# Patient Record
Sex: Male | Born: 1995 | Race: White | Hispanic: No | Marital: Single | Smoking: Current every day smoker
Health system: Southern US, Community
[De-identification: ages and names within clinical notes are randomized; demographics above are authoritative.]

## PROBLEM LIST (undated history)

## (undated) DIAGNOSIS — F32A Depression, unspecified: Secondary | ICD-10-CM

## (undated) DIAGNOSIS — F329 Major depressive disorder, single episode, unspecified: Secondary | ICD-10-CM

## (undated) HISTORY — PX: TONSILLECTOMY: SUR1361

---
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
## 2013-01-17 DIAGNOSIS — N4889 Other specified disorders of penis: Secondary | ICD-10-CM | POA: Insufficient documentation

## 2013-01-17 DIAGNOSIS — F17200 Nicotine dependence, unspecified, uncomplicated: Secondary | ICD-10-CM | POA: Insufficient documentation

## 2013-01-17 DIAGNOSIS — R110 Nausea: Secondary | ICD-10-CM | POA: Insufficient documentation

## 2013-01-17 DIAGNOSIS — Z880 Allergy status to penicillin: Secondary | ICD-10-CM | POA: Insufficient documentation

## 2013-01-17 DIAGNOSIS — R109 Unspecified abdominal pain: Secondary | ICD-10-CM | POA: Insufficient documentation

## 2013-01-17 DIAGNOSIS — Z8659 Personal history of other mental and behavioral disorders: Secondary | ICD-10-CM | POA: Insufficient documentation

## 2013-01-17 DIAGNOSIS — N509 Disorder of male genital organs, unspecified: Secondary | ICD-10-CM | POA: Insufficient documentation

## 2013-01-18 ENCOUNTER — Emergency Department (HOSPITAL_COMMUNITY)
Admission: EM | Admit: 2013-01-18 | Discharge: 2013-01-18 | Disposition: A | Discharge status: Home / Self Care | Payer: Medicaid Other | Attending: Emergency Medicine | Admitting: Emergency Medicine

## 2013-01-18 ENCOUNTER — Encounter (HOSPITAL_COMMUNITY): Payer: Self-pay | Admitting: Emergency Medicine

## 2013-01-18 ENCOUNTER — Emergency Department (HOSPITAL_COMMUNITY): Payer: Medicaid Other

## 2013-01-18 DIAGNOSIS — N50811 Right testicular pain: Secondary | ICD-10-CM

## 2013-01-18 HISTORY — DX: Major depressive disorder, single episode, unspecified: F32.9

## 2013-01-18 HISTORY — DX: Depression, unspecified: F32.A

## 2013-01-18 LAB — URINALYSIS, ROUTINE W REFLEX MICROSCOPIC
Glucose, UA: NEGATIVE mg/dL
Hgb urine dipstick: NEGATIVE
Ketones, ur: NEGATIVE mg/dL
Leukocytes, UA: NEGATIVE
Protein, ur: 30 mg/dL — AB
pH: 6 (ref 5.0–8.0)

## 2013-01-18 LAB — URINE MICROSCOPIC-ADD ON

## 2013-01-18 MED ORDER — IBUPROFEN 600 MG PO TABS
600.0000 mg | ORAL_TABLET | Freq: Four times a day (QID) | ORAL | Status: DC | PRN
Start: 2013-01-18 — End: 2013-01-18

## 2013-01-18 MED ORDER — ONDANSETRON HCL 4 MG/2ML IJ SOLN
4.0000 mg | Freq: Once | INTRAMUSCULAR | Status: AC
  Administered 2013-01-18: 4 mg via INTRAVENOUS
  Filled 2013-01-18: qty 2

## 2013-01-18 MED ORDER — SODIUM CHLORIDE 0.9 % IV SOLN
Freq: Once | INTRAVENOUS | Status: AC
  Administered 2013-01-18: 01:00:00 via INTRAVENOUS

## 2013-01-18 MED ORDER — IBUPROFEN 600 MG PO TABS: 600.0000 mg | ORAL_TABLET | Freq: Four times a day (QID) | ORAL | Status: DC | PRN

## 2013-01-18 MED ORDER — HYDROMORPHONE HCL PF 1 MG/ML IJ SOLN
1.0000 mg | Freq: Once | INTRAMUSCULAR | Status: AC
  Administered 2013-01-18: 1 mg via INTRAVENOUS
  Filled 2013-01-18: qty 1

## 2013-01-18 MED ORDER — HYDROMORPHONE HCL PF 1 MG/ML IJ SOLN
1.0000 mg | Freq: Once | INTRAMUSCULAR | Status: AC
  Administered 2013-01-18: 1 mg via INTRAMUSCULAR
  Filled 2013-01-18: qty 1

## 2013-01-18 MED ORDER — KETOROLAC TROMETHAMINE 30 MG/ML IJ SOLN
30.0000 mg | Freq: Once | INTRAMUSCULAR | Status: AC
  Administered 2013-01-18: 30 mg via INTRAVENOUS
  Filled 2013-01-18: qty 1

## 2013-01-18 NOTE — ED Notes (Signed)
Pt comfortable with d/c and f/u instructions. Prescriptions x1 

## 2013-01-18 NOTE — ED Provider Notes (Signed)
History    CSN: [id number] Arrival date & time 01/17/13  2358  First MD Initiated Contact with Patient 01/18/13 0026     Chief Complaint  Patient presents with  . Testicle Pain   (Consider location/radiation/quality/duration/timing/severity/associated sxs/prior Treatment) HPI Comments: Patient states, that immediately after having intercourse.  He developed right testicular pain.  That is excruciating and is now radiating into his abdomen.  Denies any injury, or trauma.  Patient is a 17 y.o. male presenting with testicular pain.  Testicle Pain This is a new problem. The current episode started today. The problem occurs constantly. The problem has been gradually worsening. Associated symptoms include abdominal pain and nausea. Pertinent negatives include no chills, fever or rash. The symptoms are aggravated by exertion and walking. He has tried nothing for the symptoms. The treatment provided no relief.   Past Medical History  Diagnosis Date  . Depression    Past Surgical History  Procedure Laterality Date  . Tonsillectomy     Family History  Problem Relation Age of Onset  . Diabetes     History  Substance Use Topics  . Smoking status: Current Every Day Smoker -- 1.00 packs/day    Types: Cigarettes  . Smokeless tobacco: Not on file  . Alcohol Use: Yes     Comment: social    Review of Systems  Constitutional: Negative for fever and chills.  Gastrointestinal: Positive for nausea and abdominal pain.  Genitourinary: Positive for penile swelling and testicular pain. Negative for discharge, scrotal swelling, genital sores and penile pain.  Skin: Negative for rash and wound.  All other systems reviewed and are negative.    Allergies  Penicillins  Home Medications   Current Outpatient Rx  Name  Route  Sig  Dispense  Refill  . acetaminophen (TYLENOL) 500 MG tablet   Oral   Take 250 mg by mouth once.         Marland Kitchen ibuprofen (ADVIL,MOTRIN) 600 MG tablet   Oral   Take  1 tablet (600 mg total) by mouth every 6 (six) hours as needed for pain.   30 tablet   0    BP 154/89  Pulse 97  Temp(Src) 98.1 F (36.7 C) (Oral)  Resp 18  SpO2 99% Physical Exam  Nursing note and vitals reviewed. Constitutional: He appears well-developed and well-nourished. He appears distressed.  Eyes: Pupils are equal, round, and reactive to light.  Neck: Normal range of motion.  Cardiovascular: Normal rate.   Pulmonary/Chest: Effort normal and breath sounds normal.  Abdominal: Soft. Bowel sounds are normal. He exhibits no distension. There is no tenderness.  Genitourinary: Right testis shows tenderness. Right testis shows no swelling. Cremasteric reflex is absent on the right side. Left testis shows no swelling and no tenderness. Cremasteric reflex is not absent on the left side. No penile tenderness.  Right testis is high riding the cremasteric reflexes.  Absent on the right, present on the left    ED Course  Procedures (including critical care time) Labs Reviewed  URINALYSIS, ROUTINE W REFLEX MICROSCOPIC - Abnormal; Notable for the following:    Color, Urine AMBER (*)    Protein, ur 30 (*)    All other components within normal limits  URINE MICROSCOPIC-ADD ON   No results found. 1. Testicular pain, right     MDM   Will get urgent ultrasound to rule out testicular torsion.  Patient has been given IV, dilated.  For pain control, as well as Zofran 4, nausea Dr.  Wrenn to the bedside and discharge the patient with negative findings for torsion or epididymitis  Patient instructed to FU in office in 203 weeks   Arman Filter, NP 01/18/13 [phone]

## 2013-01-18 NOTE — Consult Note (Signed)
Subjective: Eugene Cohen is an 17 yo WM who I was asked to see in consultation by Dr. Norlene Campbell.   He had the onset about 5 minutes after sexual activity of right testicular pain.  This occurred about 5 hours ago.   The pain began in the inferior testicle and then spread to the anterior testicle with some radiation to the left.   The pain was severe with associated nausea but no voiding complaints.   When he was initially seen, it was reported that the testicle was high riding and the cremasteric reflex was lost.   He was given 2 mg of dilaudid and sent for scrotal US.   The US shows no abnormalities and good blood flow ruling out current torsion.  His UA has 3-6 RBC's, 0-2 WBC and few bacteria.    ROS: Negative except as above.   He denies fever and a 12 point review is negative.   Past Medical History  Diagnosis Date  . Depression    Past Surgical History  Procedure Laterality Date  . Tonsillectomy     History   Social History  . Marital Status: Single    Spouse Name: N/A    Number of Children: N/A  . Years of Education: N/A   Occupational History  . Not on file.   Social History Main Topics  . Smoking status: Current Every Day Smoker -- 1.00 packs/day    Types: Cigarettes  . Smokeless tobacco: Not on file  . Alcohol Use: Yes     Comment: social  . Drug Use: Yes    Special: Marijuana  . Sexually Active: Not on file   Other Topics Concern  . Not on file   Social History Narrative  . No narrative on file   Family History  Problem Relation Age of Onset  . Diabetes     Allergies  Allergen Reactions  . Penicillins Swelling       Objective: Vital signs in last 24 hours: Temp:  [98.1 F (36.7 C)] 98.1 F (36.7 C) (07/21 0006) Pulse Rate:  [97] 97 (07/21 0006) Resp:  [18] 18 (07/21 0006) BP: (154)/(89) 154/89 mmHg (07/21 0006) SpO2:  [99 %] 99 % (07/21 0006)  Intake/Output from previous day:   Intake/Output this shift:    General appearance: alert and no  distress Resp: clear to auscultation bilaterally Cardio: regular rate and rhythm GI: soft, non-tender; bowel sounds normal; no masses,  no organomegaly Male genitalia: normal, with a circumcised phallus.  Scrotum is not erythematous.  The testes are normal without significant tenderness or mass,  Epididymes are normal without mass or tenderness..  No hernias are noted.  Extremities: extremities normal, atraumatic, no cyanosis or edema Skin: Skin color, texture, turgor normal. No rashes or lesions Lymph nodes: Inguinal adenopathy: He has some shotty inguinal nodes.  Neurologic: Grossly normal  Lab Results:  No results found for this basename: WBC, HGB, HCT, PLT,  in the last 72 hours BMET No results found for this basename: NA, K, CL, CO2, GLUCOSE, BUN, CREATININE, CALCIUM,  in the last 72 hours PT/INR No results found for this basename: LABPROT, INR,  in the last 72 hours ABG No results found for this basename: PHART, PCO2, PO2, HCO3,  in the last 72 hours  Studies/Results: No results found. UA noted above.  Scrotal US reviewed and is normal.  I discussed the case with Earley Favor.  Anti-infectives: Anti-infectives   None      No current  facility-administered medications for this encounter.   Current Outpatient Prescriptions  Medication Sig Dispense Refill  . acetaminophen (TYLENOL) 500 MG tablet Take 250 mg by mouth once.        Assessment: Right scrotal pain of uncertain etiology.  He may have had intermittant torsion but it is not present now. He has minimal hematuria, but no obvious evidence of infection.   Plan: I believe an OTC NSAID would probably be sufficient for the pain, and he should f/u in my office in 2-3 weeks for a repeat UA for the hematuria.   CC: Dr. Norlene Campbell.    LOS: 0 days    Anner Crete 01/18/2013

## 2013-01-18 NOTE — ED Provider Notes (Signed)
Medical screening examination/treatment/procedure(s) were conducted as a shared visit with non-physician practitioner(s) and myself.  I personally evaluated the patient during the encounter.  Very concerned for testicular torsion given presentation and exam with high riding right testicle.  No improvement with open book rotation of the testicle.  Dr Annabell Howells consulted, u/s emergently obtained.  No signs of torsion.  To f/u with urology.  Olivia Mackie, MD 01/18/13 [phone]

## 2013-01-18 NOTE — ED Notes (Signed)
C/o R testicle pain x 3 hours.  States pain now radiates into lower abd.  Denies urinary complaints.  C/o nausea.

## 2013-01-31 ENCOUNTER — Emergency Department: Payer: Self-pay | Admitting: Emergency Medicine

## 2013-02-03 ENCOUNTER — Emergency Department: Payer: Self-pay | Admitting: Emergency Medicine

## 2014-02-24 ENCOUNTER — Ambulatory Visit: Payer: Self-pay | Admitting: Family Medicine

## 2015-10-11 ENCOUNTER — Encounter (HOSPITAL_COMMUNITY): Payer: Self-pay

## 2015-10-11 ENCOUNTER — Emergency Department (HOSPITAL_COMMUNITY)
Admission: EM | Admit: 2015-10-11 | Discharge: 2015-10-11 | Disposition: A | Discharge status: Home / Self Care | Payer: Medicaid Other | Attending: Emergency Medicine | Admitting: Emergency Medicine

## 2015-10-11 DIAGNOSIS — Z880 Allergy status to penicillin: Secondary | ICD-10-CM | POA: Insufficient documentation

## 2015-10-11 DIAGNOSIS — R112 Nausea with vomiting, unspecified: Secondary | ICD-10-CM | POA: Insufficient documentation

## 2015-10-11 DIAGNOSIS — F17210 Nicotine dependence, cigarettes, uncomplicated: Secondary | ICD-10-CM | POA: Insufficient documentation

## 2015-10-11 DIAGNOSIS — Z8659 Personal history of other mental and behavioral disorders: Secondary | ICD-10-CM | POA: Insufficient documentation

## 2015-10-11 DIAGNOSIS — Z79899 Other long term (current) drug therapy: Secondary | ICD-10-CM | POA: Insufficient documentation

## 2015-10-11 LAB — COMPREHENSIVE METABOLIC PANEL
ALK PHOS: 55 U/L (ref 38–126)
ALT: 24 U/L (ref 17–63)
ANION GAP: 10 (ref 5–15)
AST: 32 U/L (ref 15–41)
Albumin: 3.9 g/dL (ref 3.5–5.0)
BILIRUBIN TOTAL: 0.7 mg/dL (ref 0.3–1.2)
BUN: 9 mg/dL (ref 6–20)
CALCIUM: 9.5 mg/dL (ref 8.9–10.3)
CO2: 24 mmol/L (ref 22–32)
Chloride: 106 mmol/L (ref 101–111)
Creatinine, Ser: 1.27 mg/dL — ABNORMAL HIGH (ref 0.61–1.24)
GFR calc non Af Amer: >60 mL/min (ref >60)
Glucose, Bld: 98 mg/dL (ref 65–99)
Potassium: 4.4 mmol/L (ref 3.5–5.1)
Sodium: 140 mmol/L (ref 135–145)
TOTAL PROTEIN: 6.6 g/dL (ref 6.5–8.1)

## 2015-10-11 LAB — URINALYSIS, ROUTINE W REFLEX MICROSCOPIC
Glucose, UA: NEGATIVE mg/dL
Hgb urine dipstick: NEGATIVE
Ketones, ur: NEGATIVE mg/dL
Leukocytes, UA: NEGATIVE
Nitrite: NEGATIVE
Protein, ur: NEGATIVE mg/dL
Specific Gravity, Urine: 1.03 (ref 1.005–1.030)
pH: 5.5 (ref 5.0–8.0)

## 2015-10-11 LAB — CBC
HCT: 54.9 % — ABNORMAL HIGH (ref 39.0–52.0)
Hemoglobin: 19.7 g/dL — ABNORMAL HIGH (ref 13.0–17.0)
MCH: 33.3 pg (ref 26.0–34.0)
MCHC: 35.9 g/dL (ref 30.0–36.0)
MCV: 92.9 fL (ref 78.0–100.0)
Platelets: 202 10*3/uL (ref 150–400)
RBC: 5.91 MIL/uL — ABNORMAL HIGH (ref 4.22–5.81)
RDW: 12.6 % (ref 11.5–15.5)
WBC: 5.5 10*3/uL (ref 4.0–10.5)

## 2015-10-11 LAB — LIPASE, BLOOD: LIPASE: 35 U/L (ref 11–51)

## 2015-10-11 MED ORDER — ONDANSETRON HCL 4 MG/2ML IJ SOLN
4.0000 mg | Freq: Once | INTRAMUSCULAR | Status: AC
  Administered 2015-10-11: 4 mg via INTRAVENOUS
  Filled 2015-10-11: qty 2

## 2015-10-11 MED ORDER — ONDANSETRON HCL 4 MG PO TABS: 4.0000 mg | ORAL_TABLET | Freq: Four times a day (QID) | ORAL | Status: AC | PRN

## 2015-10-11 MED ORDER — SODIUM CHLORIDE 0.9 % IV BOLUS (SEPSIS)
1000.0000 mL | Freq: Once | INTRAVENOUS | Status: AC
  Administered 2015-10-11: 1000 mL via INTRAVENOUS

## 2015-10-11 NOTE — ED Notes (Signed)
Pt. C/o N/V/D since yesterday morning around 0800. Pt. Reports not being able to keep anything down including water. Pt. Denies abd. Pain.

## 2015-10-11 NOTE — Discharge Instructions (Signed)
[name],  Nice meeting you! Please follow-up with your primary care provider. Return to the emergency department if you develop fevers, chills, inability to tolerate foods/fluids. Feel better soon!  S. Lane HackerNicole Gennavieve Huq, PA-C   Nausea and Vomiting Nausea is a sick feeling that often comes before throwing up (vomiting). Vomiting is a reflex where stomach contents come out of your mouth. Vomiting can cause severe loss of body fluids (dehydration). Children and elderly adults can become dehydrated quickly, especially if they also have diarrhea. Nausea and vomiting are symptoms of a condition or disease. It is important to find the cause of your symptoms. CAUSES   Direct irritation of the stomach lining. This irritation can result from increased acid production (gastroesophageal reflux disease), infection, food poisoning, taking certain medicines (such as nonsteroidal anti-inflammatory drugs), alcohol use, or tobacco use.  Signals from the brain.These signals could be caused by a headache, heat exposure, an inner ear disturbance, increased pressure in the brain from injury, infection, a tumor, or a concussion, pain, emotional stimulus, or metabolic problems.  An obstruction in the gastrointestinal tract (bowel obstruction).  Illnesses such as diabetes, hepatitis, gallbladder problems, appendicitis, kidney problems, cancer, sepsis, atypical symptoms of a heart attack, or eating disorders.  Medical treatments such as chemotherapy and radiation.  Receiving medicine that makes you sleep (general anesthetic) during surgery. DIAGNOSIS Your caregiver may ask for tests to be done if the problems do not improve after a few days. Tests may also be done if symptoms are severe or if the reason for the nausea and vomiting is not clear. Tests may include:  Urine tests.  Blood tests.  Stool tests.  Cultures (to look for evidence of infection).  X-rays or other imaging studies. Test results can  help your caregiver make decisions about treatment or the need for additional tests. TREATMENT You need to stay well hydrated. Drink frequently but in small amounts.You may wish to drink water, sports drinks, clear broth, or eat frozen ice pops or gelatin dessert to help stay hydrated.When you eat, eating slowly may help prevent nausea.There are also some antinausea medicines that may help prevent nausea. HOME CARE INSTRUCTIONS   Take all medicine as directed by your caregiver.  If you do not have an appetite, do not force yourself to eat. However, you must continue to drink fluids.  If you have an appetite, eat a normal diet unless your caregiver tells you differently.  Eat a variety of complex carbohydrates (rice, wheat, potatoes, bread), lean meats, yogurt, fruits, and vegetables.  Avoid high-fat foods because they are more difficult to digest.  Drink enough water and fluids to keep your urine clear or pale yellow.  If you are dehydrated, ask your caregiver for specific rehydration instructions. Signs of dehydration may include:  Severe thirst.  Dry lips and mouth.  Dizziness.  Dark urine.  Decreasing urine frequency and amount.  Confusion.  Rapid breathing or pulse. SEEK IMMEDIATE MEDICAL CARE IF:   You have blood or brown flecks (like coffee grounds) in your vomit.  You have black or bloody stools.  You have a severe headache or stiff neck.  You are confused.  You have severe abdominal pain.  You have chest pain or trouble breathing.  You do not urinate at least once every 8 hours.  You develop cold or clammy skin.  You continue to vomit for longer than 24 to 48 hours.  You have a fever. MAKE SURE YOU:   Understand these instructions.  Will watch your condition.  Will get help right away if you are not doing well or get worse.   This information is not intended to replace advice given to you by your health care provider. Make sure you discuss any  questions you have with your health care provider.   Document Released: 06/17/2005 Document Revised: 09/09/2011 Document Reviewed: 11/14/2010 Elsevier Interactive Patient Education [employer].

## 2015-10-11 NOTE — ED Notes (Signed)
EDP at bedside  

## 2015-10-11 NOTE — ED Provider Notes (Signed)
CSN: [id number]     Arrival date & time 10/11/15  [id number] History   First MD Initiated Contact with Patient 10/11/15 [id number]     Chief Complaint  Patient presents with  . Emesis   HPI   Eugene Cohen is a 20 y.o. male PMH significant for depression presenting with a 1 day history of nausea, vomiting, diarrhea. He states he was able to eat last night around 10 PM throughout the night he had constant vomiting. He endorses ill contacts (his niece had a stomach bug when she saw them this past weekend). He denies fevers, chills, shortness of breath, chest pain, urinary complaints, abdominal pain, raw/undercooked foods, recent antibiotic use.  Past Medical History  Diagnosis Date  . Depression    Past Surgical History  Procedure Laterality Date  . Tonsillectomy     Family History  Problem Relation Age of Onset  . Diabetes     Social History  Substance Use Topics  . Smoking status: Current Every Day Smoker -- 1.00 packs/day    Types: Cigarettes  . Smokeless tobacco: None  . Alcohol Use: Yes     Comment: social    Review of Systems  Ten systems are reviewed and are negative for acute change except as noted in the HPI  Allergies  Penicillins  Home Medications   Prior to Admission medications   Medication Sig Start Date End Date Taking? Authorizing Provider  acetaminophen (TYLENOL) 500 MG tablet Take 250 mg by mouth once.   Yes Historical Provider, MD  ibuprofen (ADVIL,MOTRIN) 600 MG tablet Take 1 tablet (600 mg total) by mouth every 6 (six) hours as needed for pain. 01/18/13  Yes Earley FavorGail Schulz, NP   BP 143/106 mmHg  Pulse 87  Temp(Src) 98.7 F (37.1 C) (Oral)  Resp 17  Ht 6' (1.829 m)  Wt 72.576 kg  BMI 21.70 kg/m2  SpO2 97% Physical Exam  Constitutional: He appears well-developed and well-nourished. No distress.  HENT:  Head: Normocephalic and atraumatic.  Mouth/Throat: Oropharynx is clear and moist. No oropharyngeal exudate.  Eyes: Conjunctivae are normal. Pupils are equal,  round, and reactive to light. Right eye exhibits no discharge. Left eye exhibits no discharge. No scleral icterus.  Neck: No tracheal deviation present.  Cardiovascular: Normal rate, regular rhythm, normal heart sounds and intact distal pulses.  Exam reveals no gallop and no friction rub.   No murmur heard. Pulmonary/Chest: Effort normal and breath sounds normal. No respiratory distress. He has no wheezes. He has no rales. He exhibits no tenderness.  Abdominal: Soft. Bowel sounds are normal. He exhibits no distension and no mass. There is no tenderness. There is no rebound and no guarding.  Musculoskeletal: He exhibits no edema.  Lymphadenopathy:    He has no cervical adenopathy.  Neurological: He is alert. Coordination normal.  Skin: Skin is warm and dry. No rash noted. He is not diaphoretic. No erythema.  Psychiatric: He has a normal mood and affect. His behavior is normal.  Nursing note and vitals reviewed.   ED Course  Procedures  Labs Review Labs Reviewed  CBC - Abnormal; Notable for the following:    RBC 5.91 (*)    Hemoglobin 19.7 (*)    HCT 54.9 (*)    All other components within normal limits  COMPREHENSIVE METABOLIC PANEL  LIPASE, BLOOD  URINALYSIS, ROUTINE W REFLEX MICROSCOPIC (NOT [facility])    MDM   Final diagnoses:  Nausea and vomiting, vomiting of unspecified type   Patient non-toxic  appearing and VSS. Based on patient history and physical exam, most likely etiologies are gastroenteritis. Less likely etiologies are obstruction, appendicitis, cholecystitis, pyelonephritis, kidney stones, food poisoning, gastritis/ulcers, ischemia, perforation, torsion, alcohol intoxication or withdrawal, other drugs/toxins, vertigo (cerebellar, vertebrobasilar, vestibular), meningitis, increased intracranial pressure, intracranial bleed, migraine, tumors, systemic infection, dehydration, hypo/hyperglycemia, hyponatremia, acidosis (DKA, AKA), cardiac ischemia.   Labs Reviewed  CBC -  Abnormal; Notable for the following:    RBC 5.91 (*)    Hemoglobin 19.7 (*)    HCT 54.9 (*)    All other components within normal limits  COMPREHENSIVE METABOLIC PANEL - Abnormal; Notable for the following:    Creatinine, Ser 1.27 (*)    All other components within normal limits  LIPASE, BLOOD  URINALYSIS, ROUTINE W REFLEX MICROSCOPIC (NOT AT [facility])   CBC, lipase, urinalysis unremarkable. Creatinine of 1.27 but patient is 20 year old otherwise healthy male and has had nausea vomiting diarrhea for the last days this is most likely dehydration. Fluids were given here. Discussed results with patient and encouraged follow-up of elevated creatinine.  Patient states he had some of his girlfriends drink and is tolerating this fine. I offered for patient to have crackers here and he wanted to wait to go get something else to eat as he is feeling better and is starting to become really hungry. Medications  sodium chloride 0.9 % bolus 1,000 mL (1,000 mLs Intravenous New Bag/Given 10/11/15 0909)  ondansetron (ZOFRAN) injection 4 mg (4 mg Intravenous Given 10/11/15 4401)   Patient feels improved after observation and/or treatment in ED.  Patient may be safely discharged home with  New Prescriptions   ONDANSETRON (ZOFRAN) 4 MG TABLET    Take 1 tablet (4 mg total) by mouth every 6 (six) hours as needed for nausea or vomiting.   Discussed reasons for return. Patient to follow-up with primary care provider within one week. Patient in understanding and agreement with the plan.   Melton Krebs, PA-C 10/11/15 1033  Gwyneth Sprout, MD 10/11/15 2151

## 2016-02-13 ENCOUNTER — Encounter (HOSPITAL_COMMUNITY): Payer: Self-pay | Admitting: *Deleted

## 2016-02-13 ENCOUNTER — Emergency Department (HOSPITAL_COMMUNITY)
Admission: EM | Admit: 2016-02-13 | Discharge: 2016-02-13 | Disposition: A | Discharge status: Home / Self Care | Payer: Medicaid Other | Attending: Emergency Medicine | Admitting: Emergency Medicine

## 2016-02-13 DIAGNOSIS — F17210 Nicotine dependence, cigarettes, uncomplicated: Secondary | ICD-10-CM | POA: Insufficient documentation

## 2016-02-13 DIAGNOSIS — M778 Other enthesopathies, not elsewhere classified: Secondary | ICD-10-CM | POA: Insufficient documentation

## 2016-02-13 MED ORDER — DICLOFENAC SODIUM 75 MG PO TBEC: 75.0000 mg | DELAYED_RELEASE_TABLET | Freq: Two times a day (BID) | ORAL | 0 refills | Status: DC

## 2016-02-13 NOTE — ED Provider Notes (Signed)
[facility]-EMERGENCY DEPT Provider Note   CSN: [id number] Arrival date & time: 02/13/16  1634     History   Chief Complaint Chief Complaint  Patient presents with  . Wrist Pain    HPI Eugene Cohen is a 20 y.o. male.  HPI  Eugene Cohen is a 20 y.o. male who presents to the Emergency Department complaining of right wrist pain for 1-2 weeks.  He states that he has a new job that requires repetitive movements and pushing.  He describes pain with wrist movement.  He has been wearing a wrist brace with some relief.  He has been taking OTC pain relievers without improvement.  He denies trauma, swelling, numbness of his fingers.   Past Medical History:  Diagnosis Date  . Depression     There are no active problems to display for this patient.   Past Surgical History:  Procedure Laterality Date  . TONSILLECTOMY         Home Medications    Prior to Admission medications   Medication Sig Start Date End Date Taking? Authorizing Provider  acetaminophen (TYLENOL) 500 MG tablet Take 250 mg by mouth once.    Historical Provider, MD  ibuprofen (ADVIL,MOTRIN) 600 MG tablet Take 1 tablet (600 mg total) by mouth every 6 (six) hours as needed for pain. 01/18/13   Earley FavorGail Schulz, NP  ondansetron (ZOFRAN) 4 MG tablet Take 1 tablet (4 mg total) by mouth every 6 (six) hours as needed for nausea or vomiting. 10/11/15   Melton KrebsSamantha Nicole Riley, PA-C    Family History Family History  Problem Relation Age of Onset  . Diabetes      Social History Social History  Substance Use Topics  . Smoking status: Current Every Day Smoker    Packs/day: 1.00    Types: Cigarettes  . Smokeless tobacco: Never Used  . Alcohol use Yes     Comment: social     Allergies   Penicillins   Review of Systems Review of Systems  Constitutional: Negative for chills and fever.  Musculoskeletal: Positive for arthralgias (right wrist pain). Negative for joint swelling.  Skin: Negative for color change and wound.    Neurological: Negative for weakness and numbness.  All other systems reviewed and are negative.    Physical Exam Updated Vital Signs BP 138/96 (BP Location: Left Arm)   Pulse 86   Temp 98 F (36.7 C) (Oral)   Resp 14   Ht 5\' 11"  (1.803 m)   Wt 72.6 kg   SpO2 100%   BMI 22.32 kg/m   Physical Exam  Constitutional: He is oriented to person, place, and time. He appears well-developed and well-nourished. No distress.  HENT:  Head: Normocephalic and atraumatic.  Cardiovascular: Normal rate, regular rhythm and normal heart sounds.   Pulmonary/Chest: Effort normal and breath sounds normal.  Musculoskeletal: Normal range of motion. He exhibits tenderness. He exhibits no edema.  Diffuse tenderness with ROM of the right wrist.  No edema or erythema.  Radial pulse is brisk, distal sensation intact.  CR< 2 sec.  No bruising or bony deformity.    Neurological: He is alert and oriented to person, place, and time. He exhibits normal muscle tone. Coordination normal.  Skin: Skin is warm and dry.  Nursing note and vitals reviewed.    ED Treatments / Results  Labs (all labs ordered are listed, but only abnormal results are displayed) Labs Reviewed - No data to display  EKG  EKG Interpretation None  Radiology No results found.  Procedures Procedures (including critical care time)  Medications Ordered in ED Medications - No data to display   Initial Impression / Assessment and Plan / ED Course  I have reviewed the triage vital signs and the nursing notes.  Pertinent labs & imaging results that were available during my care of the patient were reviewed by me and considered in my medical decision making (see chart for details).  Clinical Course   Pt has a velcro wrist splint from home. Focal tenderness at distal wrist. NV intact.  Offered another splint but declined.  No concern for septic joint.    Pt disposed, but left the room prior to receiving paperwork or  prescriptions.   Final Clinical Impressions(s) / ED Diagnoses   Final diagnoses:  Tendonitis of wrist, right    New Prescriptions New Prescriptions   No medications on file     Rosey Bathammy Warda Mcqueary, PA-C 02/16/16 2132    Jacalyn LefevreJulie Haviland, MD 02/17/16 [phone]

## 2016-02-13 NOTE — ED Notes (Addendum)
Pt left mumbling down the hallway without receiving discharge papers or signing.

## 2016-02-13 NOTE — Discharge Instructions (Signed)
Wear the brace as needed.  Apply ice packs on/off to your wrist.  Follow-up with the orthopedic doctor in one week if not improving.  Do not take ibuprofen, aspirin or aleve while taking the diclofenac

## 2016-02-13 NOTE — ED Triage Notes (Signed)
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 Since then he has had increasing pain in his right wrist.

## 2016-09-20 ENCOUNTER — Encounter (HOSPITAL_COMMUNITY): Payer: Self-pay | Admitting: *Deleted

## 2016-09-20 ENCOUNTER — Emergency Department (HOSPITAL_COMMUNITY)
Admission: EM | Admit: 2016-09-20 | Discharge: 2016-09-20 | Discharge status: Against Medical Advice | Payer: Self-pay | Attending: Emergency Medicine | Admitting: Emergency Medicine

## 2016-09-20 DIAGNOSIS — W294XXA Contact with nail gun, initial encounter: Secondary | ICD-10-CM | POA: Insufficient documentation

## 2016-09-20 DIAGNOSIS — F17210 Nicotine dependence, cigarettes, uncomplicated: Secondary | ICD-10-CM | POA: Insufficient documentation

## 2016-09-20 DIAGNOSIS — L03114 Cellulitis of left upper limb: Secondary | ICD-10-CM | POA: Insufficient documentation

## 2016-09-20 DIAGNOSIS — S6992XA Unspecified injury of left wrist, hand and finger(s), initial encounter: Secondary | ICD-10-CM | POA: Insufficient documentation

## 2016-09-20 DIAGNOSIS — Y999 Unspecified external cause status: Secondary | ICD-10-CM | POA: Insufficient documentation

## 2016-09-20 DIAGNOSIS — Y939 Activity, unspecified: Secondary | ICD-10-CM | POA: Insufficient documentation

## 2016-09-20 DIAGNOSIS — Y929 Unspecified place or not applicable: Secondary | ICD-10-CM | POA: Insufficient documentation

## 2016-09-20 MED ORDER — ACETAMINOPHEN 500 MG PO TABS
1000.0000 mg | ORAL_TABLET | Freq: Once | ORAL | Status: DC
  Filled 2016-09-20: qty 2

## 2016-09-20 MED ORDER — TETANUS-DIPHTH-ACELL PERTUSSIS 5-2.5-18.5 LF-MCG/0.5 IM SUSP
0.5000 mL | Freq: Once | INTRAMUSCULAR | Status: DC
  Filled 2016-09-20: qty 0.5

## 2016-09-20 NOTE — ED Triage Notes (Signed)
Pt reports injuring left hand 3 days ago with nail gun, now has redness and swelling to hand.

## 2016-09-20 NOTE — ED Notes (Signed)
Pt requesting to leave AMA. Pt refusing to allow vitals to be taken. Pt states "I'm about to be late for a meeting." RN encouraged pt to stay to be treated. Pt continues to refuse. PA made aware.

## 2016-09-20 NOTE — ED Provider Notes (Signed)
[facility]-EMERGENCY DEPT Provider Note   CSN: [id number] Arrival date & time: 09/20/16  1315     History   Chief Complaint Chief Complaint  Patient presents with  . Hand Injury    HPI Eugene Cohen is a 21 y.o. male with no pertinent past medical history presents to the ED with injury to the left hand that occurred 3 days ago. Patient states Eugene Cohen was manipulating a nail gun when the tip of it went into the soft tissue of the left first interdigit space. Patient has noticed gradual, worsening swelling and redness. Patient is able to make a full fist but states that there is pain to the back of his first and second digits of the left hand. No fevers. Patient requesting tetanus shot.  HPI  Past Medical History:  Diagnosis Date  . Depression     There are no active problems to display for this patient.   Past Surgical History:  Procedure Laterality Date  . TONSILLECTOMY         Home Medications    Prior to Admission medications   Medication Sig Start Date End Date Taking? Authorizing Provider  acetaminophen (TYLENOL) 500 MG tablet Take 250 mg by mouth once.    Historical Provider, MD  diclofenac (VOLTAREN) 75 MG EC tablet Take 1 tablet (75 mg total) by mouth 2 (two) times daily. Take with food 02/13/16   Tammy Triplett, PA-C  ondansetron (ZOFRAN) 4 MG tablet Take 1 tablet (4 mg total) by mouth every 6 (six) hours as needed for nausea or vomiting. 10/11/15   Melton KrebsSamantha Nicole Riley, PA-C    Family History Family History  Problem Relation Age of Onset  . Diabetes      Social History Social History  Substance Use Topics  . Smoking status: Current Every Day Smoker    Packs/day: 1.00    Types: Cigarettes  . Smokeless tobacco: Never Used  . Alcohol use Yes     Comment: social     Allergies   Penicillins   Review of Systems Review of Systems  Constitutional: Negative for fever.  Gastrointestinal: Negative for nausea and vomiting.  Musculoskeletal: Positive for  arthralgias.  Skin: Positive for color change and wound.  Allergic/Immunologic: Negative for immunocompromised state.  Neurological: Negative for headaches.     Physical Exam Updated Vital Signs BP (!) 160/99 (BP Location: Right Arm)   Pulse 81   Temp 98.5 F (36.9 C) (Oral)   Resp 18   SpO2 98%   Physical Exam  Constitutional: Eugene Cohen is oriented to person, place, and time. Eugene Cohen appears well-developed and well-nourished. No distress.  HENT:  Head: Normocephalic and atraumatic.  Right Ear: External ear normal.  Left Ear: External ear normal.  Eyes: Conjunctivae are normal. No scleral icterus.  Neck: Normal range of motion.  Cardiovascular: Normal rate, regular rhythm, normal heart sounds and intact distal pulses.   No murmur heard. Pulmonary/Chest: Effort normal and breath sounds normal. Eugene Cohen has no wheezes.  Abdominal: Soft. Eugene Cohen exhibits no distension. There is no tenderness.  Musculoskeletal: Normal range of motion. Eugene Cohen exhibits no deformity.  Mild edema to the dorsal aspect of the left hand mostly over radial side. No streaking to the forearm or fingers. Tenderness over the dorsal aspect of first and second metacarpals. Full active range of motion of the left hand. Patient able to flex and extend at all joints of the left hand. Good thumb opposition. Anatomical snuffbox is nontender. Distal radius and ulnar are nontender. No  nail injury.  Neurological: Eugene Cohen is alert and oriented to person, place, and time.  Sensation to light touch in the medial, radial, ulnar distribution is intact in the left hand. 5/5 hand grip. Good pincer strength.  Skin: Skin is warm and dry. Capillary refill takes less than 2 seconds.  Adequate capillary refill in left hand digits. No palpable area of fluctuance or induration in the left hand to suggest abscess. Mild erythema without warmth to the dorsal aspect of the left hand was significant at first and second metacarpals.  Psychiatric: Eugene Cohen has a normal mood and  affect. His behavior is normal. Judgment and thought content normal.  Nursing note and vitals reviewed.      ED Treatments / Results  Labs (all labs ordered are listed, but only abnormal results are displayed) Labs Reviewed - No data to display  EKG  EKG Interpretation None       Radiology No results found.  Procedures Procedures (including critical care time)  Medications Ordered in ED Medications  Tdap (BOOSTRIX) injection 0.5 mL (not administered)  acetaminophen (TYLENOL) tablet 1,000 mg (not administered)     Initial Impression / Assessment and Plan / ED Course  I have reviewed the triage vital signs and the nursing notes.  Pertinent labs & imaging results that were available during my care of the patient were reviewed by me and considered in my medical decision making (see chart for details).      Patient left AMA before getting tetanus injection. Was not able to provide prescription for antibiotics.   Final Clinical Impressions(s) / ED Diagnoses   Final diagnoses:  Injury of left hand, initial encounter  Cellulitis of left upper extremity    New Prescriptions New Prescriptions   No medications on file     Liberty Handy, PA-C 09/20/16 1650    Mancel Bale, MD 09/21/16 [phone]

## 2017-02-26 ENCOUNTER — Encounter (HOSPITAL_COMMUNITY): Payer: Self-pay | Admitting: Emergency Medicine

## 2017-02-26 ENCOUNTER — Emergency Department (HOSPITAL_COMMUNITY): Payer: Self-pay

## 2017-02-26 ENCOUNTER — Inpatient Hospital Stay (HOSPITAL_COMMUNITY)
Admission: EM | Admit: 2017-02-26 | Discharge: 2017-02-26 | DRG: 684 | Discharge status: Against Medical Advice | Payer: Self-pay | Attending: Internal Medicine | Admitting: Internal Medicine

## 2017-02-26 DIAGNOSIS — R112 Nausea with vomiting, unspecified: Secondary | ICD-10-CM

## 2017-02-26 DIAGNOSIS — R509 Fever, unspecified: Secondary | ICD-10-CM

## 2017-02-26 DIAGNOSIS — N179 Acute kidney failure, unspecified: Principal | ICD-10-CM | POA: Diagnosis present

## 2017-02-26 DIAGNOSIS — E860 Dehydration: Secondary | ICD-10-CM | POA: Diagnosis present

## 2017-02-26 DIAGNOSIS — Z79899 Other long term (current) drug therapy: Secondary | ICD-10-CM

## 2017-02-26 DIAGNOSIS — Z880 Allergy status to penicillin: Secondary | ICD-10-CM

## 2017-02-26 DIAGNOSIS — F17210 Nicotine dependence, cigarettes, uncomplicated: Secondary | ICD-10-CM | POA: Diagnosis present

## 2017-02-26 LAB — URINALYSIS, ROUTINE W REFLEX MICROSCOPIC
Bacteria, UA: NONE SEEN
Glucose, UA: NEGATIVE mg/dL
HGB URINE DIPSTICK: NEGATIVE
Ketones, ur: 20 mg/dL — AB
LEUKOCYTES UA: NEGATIVE
Nitrite: NEGATIVE
PH: 5 (ref 5.0–8.0)
Protein, ur: 100 mg/dL — AB
SPECIFIC GRAVITY, URINE: 1.019 (ref 1.005–1.030)

## 2017-02-26 LAB — COMPREHENSIVE METABOLIC PANEL
ALT: 22 U/L (ref 17–63)
ANION GAP: 16 — AB (ref 5–15)
AST: 31 U/L (ref 15–41)
Albumin: 4.8 g/dL (ref 3.5–5.0)
Alkaline Phosphatase: 82 U/L (ref 38–126)
BILIRUBIN TOTAL: 0.8 mg/dL (ref 0.3–1.2)
BUN: 14 mg/dL (ref 6–20)
CHLORIDE: 97 mmol/L — AB (ref 101–111)
CO2: 22 mmol/L (ref 22–32)
Calcium: 9.6 mg/dL (ref 8.9–10.3)
Creatinine, Ser: 2.82 mg/dL — ABNORMAL HIGH (ref 0.61–1.24)
GFR, EST AFRICAN AMERICAN: 35 mL/min — AB (ref >60)
GFR, EST NON AFRICAN AMERICAN: 30 mL/min — AB (ref >60)
Glucose, Bld: 164 mg/dL — ABNORMAL HIGH (ref 65–99)
POTASSIUM: 3.5 mmol/L (ref 3.5–5.1)
Sodium: 135 mmol/L (ref 135–145)
TOTAL PROTEIN: 8.3 g/dL — AB (ref 6.5–8.1)

## 2017-02-26 LAB — CBC WITH DIFFERENTIAL/PLATELET
Basophils Absolute: 0 10*3/uL (ref 0.0–0.1)
Basophils Relative: 0 %
EOS PCT: 1 %
Eosinophils Absolute: 0.1 10*3/uL (ref 0.0–0.7)
HEMATOCRIT: 49.7 % (ref 39.0–52.0)
Hemoglobin: 18.3 g/dL — ABNORMAL HIGH (ref 13.0–17.0)
Lymphocytes Relative: 14 %
Lymphs Abs: 1.2 10*3/uL (ref 0.7–4.0)
MCH: 32.7 pg (ref 26.0–34.0)
MCHC: 36.8 g/dL — AB (ref 30.0–36.0)
MCV: 88.9 fL (ref 78.0–100.0)
MONO ABS: 1.2 10*3/uL — AB (ref 0.1–1.0)
Monocytes Relative: 14 %
NEUTROS ABS: 6.3 10*3/uL (ref 1.7–7.7)
NEUTROS PCT: 72 %
PLATELETS: 311 10*3/uL (ref 150–400)
RBC: 5.59 MIL/uL (ref 4.22–5.81)
RDW: 12.8 % (ref 11.5–15.5)
WBC: 8.8 10*3/uL (ref 4.0–10.5)

## 2017-02-26 LAB — RAPID URINE DRUG SCREEN, HOSP PERFORMED
Amphetamines: POSITIVE — AB
BENZODIAZEPINES: POSITIVE — AB
Barbiturates: NOT DETECTED
Cocaine: NOT DETECTED
OPIATES: POSITIVE — AB
TETRAHYDROCANNABINOL: POSITIVE — AB

## 2017-02-26 LAB — ETHANOL

## 2017-02-26 LAB — I-STAT CG4 LACTIC ACID, ED: LACTIC ACID, VENOUS: 1.39 mmol/L (ref 0.5–1.9)

## 2017-02-26 LAB — LIPASE, BLOOD: LIPASE: 29 U/L (ref 11–51)

## 2017-02-26 LAB — CK: Total CK: 202 U/L (ref 49–397)

## 2017-02-26 MED ORDER — SODIUM CHLORIDE 0.9 % IV BOLUS (SEPSIS)
1000.0000 mL | Freq: Once | INTRAVENOUS | Status: AC
  Administered 2017-02-26: 1000 mL via INTRAVENOUS

## 2017-02-26 MED ORDER — MORPHINE SULFATE (PF) 2 MG/ML IV SOLN
2.0000 mg | Freq: Once | INTRAVENOUS | Status: AC
  Administered 2017-02-26: 2 mg via INTRAVENOUS
  Filled 2017-02-26: qty 1

## 2017-02-26 MED ORDER — ACETAMINOPHEN 500 MG PO TABS
1000.0000 mg | ORAL_TABLET | Freq: Once | ORAL | Status: DC
  Administered 2017-02-26: 1000 mg via ORAL
  Filled 2017-02-26: qty 2

## 2017-02-26 MED ORDER — IPRATROPIUM BROMIDE 0.02 % IN SOLN
0.5000 mg | Freq: Once | RESPIRATORY_TRACT | Status: AC
  Administered 2017-02-26: 0.5 mg via RESPIRATORY_TRACT
  Filled 2017-02-26: qty 2.5

## 2017-02-26 MED ORDER — ONDANSETRON HCL 4 MG/2ML IJ SOLN
4.0000 mg | Freq: Once | INTRAMUSCULAR | Status: AC
  Administered 2017-02-26: 4 mg via INTRAVENOUS
  Filled 2017-02-26: qty 2

## 2017-02-26 MED ORDER — SODIUM CHLORIDE 0.9 % IV BOLUS (SEPSIS): 1000.0000 mL | Freq: Once | INTRAVENOUS | Status: DC

## 2017-02-26 MED ORDER — AMPHETAMINE-DEXTROAMPHET ER 10 MG PO CP24
30.0000 mg | ORAL_CAPSULE | Freq: Every day | ORAL | Status: DC
  Administered 2017-02-26: 30 mg via ORAL
  Filled 2017-02-26: qty 3

## 2017-02-26 MED ORDER — ONDANSETRON HCL 4 MG PO TABS: 4.0000 mg | ORAL_TABLET | Freq: Four times a day (QID) | ORAL | Status: DC | PRN

## 2017-02-26 MED ORDER — ALBUTEROL SULFATE (2.5 MG/3ML) 0.083% IN NEBU
5.0000 mg | INHALATION_SOLUTION | Freq: Once | RESPIRATORY_TRACT | Status: AC
  Administered 2017-02-26: 5 mg via RESPIRATORY_TRACT
  Filled 2017-02-26: qty 6

## 2017-02-26 MED ORDER — TRAMADOL HCL 50 MG PO TABS
50.0000 mg | ORAL_TABLET | Freq: Four times a day (QID) | ORAL | Status: DC | PRN
  Administered 2017-02-26: 50 mg via ORAL
  Filled 2017-02-26: qty 1

## 2017-02-26 MED ORDER — PANTOPRAZOLE SODIUM 40 MG IV SOLR
40.0000 mg | Freq: Two times a day (BID) | INTRAVENOUS | Status: DC
  Administered 2017-02-26: 40 mg via INTRAVENOUS
  Filled 2017-02-26: qty 40

## 2017-02-26 MED ORDER — ONDANSETRON HCL 4 MG/2ML IJ SOLN
4.0000 mg | Freq: Four times a day (QID) | INTRAMUSCULAR | Status: DC
  Administered 2017-02-26: 4 mg via INTRAVENOUS
  Filled 2017-02-26: qty 2

## 2017-02-26 MED ORDER — SODIUM CHLORIDE 0.9 % IV SOLN
INTRAVENOUS | Status: DC
  Administered 2017-02-26: 11:00:00 via INTRAVENOUS

## 2017-02-26 MED ORDER — THIAMINE HCL 100 MG/ML IJ SOLN
100.0000 mg | Freq: Every day | INTRAMUSCULAR | Status: DC
  Administered 2017-02-26: 100 mg via INTRAVENOUS
  Filled 2017-02-26 (×2): qty 2

## 2017-02-26 MED ORDER — POLYETHYLENE GLYCOL 3350 17 G PO PACK: 17.0000 g | PACK | Freq: Every day | ORAL | Status: DC

## 2017-02-26 MED ORDER — ONDANSETRON HCL 4 MG/2ML IJ SOLN: 4.0000 mg | Freq: Four times a day (QID) | INTRAMUSCULAR | Status: DC | PRN

## 2017-02-26 MED ORDER — POTASSIUM CHLORIDE 10 MEQ/100ML IV SOLN
10.0000 meq | INTRAVENOUS | Status: AC
  Administered 2017-02-26 (×3): 10 meq via INTRAVENOUS
  Filled 2017-02-26 (×5): qty 100

## 2017-02-26 MED ORDER — SODIUM CHLORIDE 0.9 % IV SOLN: INTRAVENOUS | Status: DC

## 2017-02-26 NOTE — ED Provider Notes (Signed)
[facility]-EMERGENCY DEPT Provider Note   CSN: [id number] Arrival date & time: 02/26/17  0515     History   Chief Complaint Chief Complaint  Patient presents with  . Nausea  . Emesis    HPI Eugene Cohen is a 21 y.o. male with a hx of Daily alcohol usage, depression, tonsillectomy presents to the Emergency Department complaining of gradual, persistent, progressively worsening generalized abdominal pain, worse in the epigastrium onset 2 days ago. Patient reports drinking approximately one fifth of liquor on Sunday. He states he does drink daily. He reports for the last 24 hours he's had nonbloody and nonbilious emesis, unknown how many episodes. Patient denies dysuria, hematuria, testicular pain, penile discharge. Nothing seems to make his symptoms better or worse. Patient denies known take bites, rash, headache, neck pain, chest pain, shortness of breath, diarrhea, rash, international travel or sick contacts.  Pt reports approx 1 week of URI symptoms with cough and rhinorrhea.    The history is provided by the patient and medical records. No language interpreter was used.    Past Medical History:  Diagnosis Date  . Depression     Patient Active Problem List   Diagnosis Date Noted  . AKI (acute kidney injury) (HCC) 02/26/2017    Past Surgical History:  Procedure Laterality Date  . TONSILLECTOMY         Home Medications    Prior to Admission medications   Medication Sig Start Date End Date Taking? Authorizing Provider  amphetamine-dextroamphetamine (ADDERALL XR) 30 MG 24 hr capsule Take 30 mg by mouth daily.   Yes [provider]  guaiFENesin (MUCINEX) 600 MG 12 hr tablet Take 600 mg by mouth once.   Yes [provider]  ibuprofen (ADVIL,MOTRIN) 200 MG tablet Take 600 mg by mouth every 6 (six) hours as needed for moderate pain.   Yes [provider]  Pseudoephedrine-APAP-DM (DAYQUIL PO) Take 1 capsule by mouth every 4 (four) hours as needed (cold).    Yes [provider]  diclofenac (VOLTAREN) 75 MG EC tablet Take 1 tablet (75 mg total) by mouth 2 (two) times daily. Take with food Patient not taking: Reported on 02/26/2017 02/13/16   Triplett, Tammy, PA-C  ondansetron (ZOFRAN) 4 MG tablet Take 1 tablet (4 mg total) by mouth every 6 (six) hours as needed for nausea or vomiting. Patient not taking: Reported on 02/26/2017 10/11/15   Melton Krebs, PA-C    Family History Family History  Problem Relation Age of Onset  . Diabetes Unknown     Social History Social History  Substance Use Topics  . Smoking status: Current Every Day Smoker    Packs/day: 1.00    Types: Cigarettes  . Smokeless tobacco: Never Used  . Alcohol use Yes     Comment: social     Allergies   Penicillins   Review of Systems Review of Systems  Constitutional: Negative for appetite change, diaphoresis, fatigue, fever and unexpected weight change.  HENT: Negative for mouth sores.   Eyes: Negative for visual disturbance.  Respiratory: Negative for cough, chest tightness, shortness of breath and wheezing.   Cardiovascular: Negative for chest pain.  Gastrointestinal: Positive for abdominal pain, nausea and vomiting. Negative for constipation and diarrhea.  Endocrine: Negative for polydipsia, polyphagia and polyuria.  Genitourinary: Negative for dysuria, frequency, hematuria and urgency.  Musculoskeletal: Negative for back pain and neck stiffness.  Skin: Negative for rash.  Allergic/Immunologic: Negative for immunocompromised state.  Neurological: Negative for syncope, light-headedness and headaches.  Hematological: Does not bruise/bleed easily.  Psychiatric/Behavioral: Negative for sleep disturbance. The patient is not nervous/anxious.   All other systems reviewed and are negative.    Physical Exam Updated Vital Signs BP (!) 168/115 (BP Location: Right Arm)   Pulse (!) 109   Temp (!) 100.6 F (38.1 C) (Rectal)   Resp 18   SpO2 97%    Physical Exam  Constitutional: He appears well-developed and well-nourished. He appears distressed.  Awake, alert, nontoxic appearance  HENT:  Head: Normocephalic and atraumatic.  Mouth/Throat: Oropharynx is clear and moist. No oropharyngeal exudate.  Eyes: Conjunctivae are normal. No scleral icterus.  Neck: Normal range of motion. Neck supple.  Cardiovascular: Normal rate, regular rhythm and intact distal pulses.   Pulmonary/Chest: Effort normal. No respiratory distress. He has wheezes (all fields).  Equal chest expansion Congested cough  Abdominal: Soft. Bowel sounds are normal. He exhibits no mass. There is generalized tenderness. There is no rigidity, no rebound, no guarding and no CVA tenderness. Hernia confirmed negative in the right inguinal area and confirmed negative in the left inguinal area.  Genitourinary: Testes normal and penis normal. Cremasteric reflex is present. Circumcised.  Genitourinary Comments: Chaperone present  Musculoskeletal: Normal range of motion. He exhibits no edema.  Lymphadenopathy: No inguinal adenopathy noted on the right or left side.  Neurological: He is alert.  Speech is clear and goal oriented Moves extremities without ataxia  Skin: Skin is warm and dry. He is not diaphoretic. There is pallor.  Psychiatric: He has a normal mood and affect.  Nursing note and vitals reviewed.    ED Treatments / Results  Labs (all labs ordered are listed, but only abnormal results are displayed) Labs Reviewed  CBC WITH DIFFERENTIAL/PLATELET - Abnormal; Notable for the following:       Result Value   Hemoglobin 18.3 (*)    MCHC 36.8 (*)    Monocytes Absolute 1.2 (*)    All other components within normal limits  COMPREHENSIVE METABOLIC PANEL - Abnormal; Notable for the following:    Chloride 97 (*)    Glucose, Bld 164 (*)    Creatinine, Ser 2.82 (*)    Total Protein 8.3 (*)    GFR calc non Af Amer 30 (*)    GFR calc Af Amer 35 (*)    Anion gap 16 (*)     All other components within normal limits  LIPASE, BLOOD  ETHANOL  URINALYSIS, ROUTINE W REFLEX MICROSCOPIC  RAPID URINE DRUG SCREEN, HOSP PERFORMED  CK  I-STAT CG4 LACTIC ACID, ED     Radiology Dg Abdomen Acute W/chest  Result Date: 02/26/2017 CLINICAL DATA:  New onset mid abdominal pain with nausea and vomiting, 24 hours duration. EXAM: DG ABDOMEN ACUTE W/ 1V CHEST COMPARISON:  None. FINDINGS: There is no evidence of dilated bowel loops or free intraperitoneal air. No radiopaque calculi or other significant radiographic abnormality is seen. Heart size and mediastinal contours are within normal limits. Both lungs are clear. IMPRESSION: Negative abdominal radiographs.  No acute cardiopulmonary disease. Electronically Signed   By: Ellery Plunkaniel R Mitchell M.D.   On: 02/26/2017 06:12    Procedures Procedures (including critical care time)  Medications Ordered in ED Medications  sodium chloride 0.9 % bolus 1,000 mL (not administered)  sodium chloride 0.9 % bolus 1,000 mL (not administered)  sodium chloride 0.9 % bolus 1,000 mL (not administered)  0.9 %  sodium chloride infusion (not administered)  sodium chloride 0.9 % bolus 1,000 mL (0 mLs Intravenous Stopped  02/26/17 0703)  ondansetron (ZOFRAN) injection 4 mg (4 mg Intravenous Given 02/26/17 0553)  morphine 2 MG/ML injection 2 mg (2 mg Intravenous Given 02/26/17 0554)  albuterol (PROVENTIL) (2.5 MG/3ML) 0.083% nebulizer solution 5 mg (5 mg Nebulization Given 02/26/17 0604)  ipratropium (ATROVENT) nebulizer solution 0.5 mg (0.5 mg Nebulization Given 02/26/17 0604)     Initial Impression / Assessment and Plan / ED Course  I have reviewed the triage vital signs and the nursing notes.  Pertinent labs & imaging results that were available during my care of the patient were reviewed by me and considered in my medical decision making (see chart for details).  Clinical Course as of Feb 26 729  Wed Feb 26, 2017  4098 Discussed with Dr. Robb Matar  who will admit.  [HM]    Clinical Course User Index [HM] Reiner Loewen, Boyd Kerbs    Patient presents emergency department 24 hours of vomiting. Labs concerning for acute renal failure. Significant elevation in serum creatinine. Normal lactic acid. Patient is hypertensive and tachycardic. He denies drug usage. Drug screen pending. Patient with low-grade fever 100.6.  No elevation in liver enzymes. Anion gap of 16.  Pt given albuterol for wheezing with improvement.  Pt with persistent expiratory wheeze, but declines additional treatments.    Patient will need admission for severe dehydration, AKI. CK and UA pending.  Long discussion with patient about admission. He reports he will be admitted for 12-24 hours but then he will need to go home.  We have discussed the risk of leaving AMA and pt states understanding.  He is in agreement for me to proceed with admission at this time.  7:29 AM Discussed with Dr. Robb Matar who will admit.  Final Clinical Impressions(s) / ED Diagnoses   Final diagnoses:  Non-intractable vomiting with nausea, unspecified vomiting type  AKI (acute kidney injury) [facility])    New Prescriptions New Prescriptions   No medications on file     Milta Deiters 02/26/17 0730    Brock Larmon, Dahlia Client, PA-C 02/26/17 0738    Ward, Layla Maw, DO 02/26/17 [id number]

## 2017-02-26 NOTE — H&P (Addendum)
History and Physical    Eugene Cohen ZOX:[id number] DOB: 1996/05/25 DOA: 02/26/2017  PCP: Patient, No Pcp Per  Patient coming from: home  Chief Complaint: nausea and vomiting  HPI: Eugene Cohen is a 21 y.o. male with medical history significant of marijuana use, daily alcohol and ibuprofen for back pain who works as a Corporate investment banker comes in for nausea and vomiting that started about 2 days prior to admission. She started having nausea and vomiting nonbloody, 4 days prior to this he was drinking heavily, and taking ongoing ibuprofen for back pain. He relates after this he started having abdominal pain. His abdominal pain is around the rib cage. Yesterday he worked outside for about 6 hours, starting at noon and he started feeling dizzy by the end of the day so he stopped. He did not tolerate anything by mouth yesterday, he relates the last time he urinate was the day prior to admission it was really concentrated. He denies some cough nonproductive, no fever or shortness of breath.   ED Course:  He has found to have a mild fever 100.6, with acute renal failure of 2.8 (baseline creatinine 1.2) lipase and LFTs are within normal limits. Lactic acid is 1.3, his blood cell count is 8.8, CK is pending, abdominal x-ray show no findings.  Review of Systems: As per HPI otherwise 10 point review of systems negative.    Past Medical History:  Diagnosis Date  . Depression     Past Surgical History:  Procedure Laterality Date  . TONSILLECTOMY       reports that he has been smoking Cigarettes.  He has been smoking about 2.00 packs per day. He has never used smokeless tobacco. He reports that he drinks about 10.2 oz of alcohol per week . He reports that he uses drugs, including Marijuana.  Allergies  Allergen Reactions  . Penicillins Swelling    Has patient had a PCN reaction causing immediate rash, facial/tongue/throat swelling, SOB or lightheadedness with hypotension: Unknown Has patient had  a PCN reaction causing severe rash involving mucus membranes or skin necrosis: Unknown Has patient had a PCN reaction that required hospitalization: Unknown Has patient had a PCN reaction occurring within the last 10 years: No If all of the above answers are "NO", then may proceed with Cephalosporin use.     Family History  Problem Relation Age of Onset  . Diabetes Unknown     Prior to Admission medications   Medication Sig Start Date End Date Taking? Authorizing Provider  amphetamine-dextroamphetamine (ADDERALL XR) 30 MG 24 hr capsule Take 30 mg by mouth daily.   Yes [provider]  guaiFENesin (MUCINEX) 600 MG 12 hr tablet Take 600 mg by mouth once.   Yes [provider]  ibuprofen (ADVIL,MOTRIN) 200 MG tablet Take 600 mg by mouth every 6 (six) hours as needed for moderate pain.   Yes [provider]  Pseudoephedrine-APAP-DM (DAYQUIL PO) Take 1 capsule by mouth every 4 (four) hours as needed (cold).   Yes [provider]  diclofenac (VOLTAREN) 75 MG EC tablet Take 1 tablet (75 mg total) by mouth 2 (two) times daily. Take with food Patient not taking: Reported on 02/26/2017 02/13/16   Triplett, Tammy, PA-C  ondansetron (ZOFRAN) 4 MG tablet Take 1 tablet (4 mg total) by mouth every 6 (six) hours as needed for nausea or vomiting. Patient not taking: Reported on 02/26/2017 10/11/15   Melton Krebs, PA-C    Physical Exam: Vitals:  02/26/17 0515 02/26/17 0542 02/26/17 0729  BP:  (!) 168/115 (!) 166/111  Pulse:  (!) 109 99  Resp:  18 16  Temp:  (!) 100.6 F (38.1 C)   TempSrc:  Rectal   SpO2: 100% 97% 100%    Constitutional: NAD, calm, comfortable Vitals:   02/26/17 0515 02/26/17 0542 02/26/17 0729  BP:  (!) 168/115 (!) 166/111  Pulse:  (!) 109 99  Resp:  18 16  Temp:  (!) 100.6 F (38.1 C)   TempSrc:  Rectal   SpO2: 100% 97% 100%   Eyes: PERRL, lids and conjunctivae normal ENMT:Poor dentition, dry mucous membrane.  Neck: normal,  supple, no masses, no thyromegaly Respiratory:Good air movement and clear to auscultation.  Cardiovascular:Railroad and rhythm with positive S1-S2.  Abdomen:Positive bowel sounds, soft nontender, with mild tenderness along the rib cage no rebound or guarding.  Musculoskeletal: n renal 4 extremities without any difficulties.  Skin: no rashes, lesions, ulcers. No induration Neurologic: CN 2-12 grossly intact. Sensation intact, DTR normal. Strength 5/5 in all 4.  Psychiatric: Normal judgment and insight. Alert and oriented x 3. Normal mood.    Labs on Admission: I have personally reviewed following labs and imaging studies  CBC:  Recent Labs Lab 02/26/17 0549  WBC 8.8  NEUTROABS 6.3  HGB 18.3*  HCT 49.7  MCV 88.9  PLT 311   Basic Metabolic Panel:  Recent Labs Lab 02/26/17 0549  NA 135  K 3.5  CL 97*  CO2 22  GLUCOSE 164*  BUN 14  CREATININE 2.82*  CALCIUM 9.6   GFR: CrCl cannot be calculated (Unknown ideal weight.). Liver Function Tests:  Recent Labs Lab 02/26/17 0549  AST 31  ALT 22  ALKPHOS 82  BILITOT 0.8  PROT 8.3*  ALBUMIN 4.8    Recent Labs Lab 02/26/17 0549  LIPASE 29   No results for input(s): AMMONIA in the last 168 hours. Coagulation Profile: No results for input(s): INR, PROTIME in the last 168 hours. Cardiac Enzymes: No results for input(s): CKTOTAL, CKMB, CKMBINDEX, TROPONINI in the last 168 hours. BNP (last 3 results) No results for input(s): PROBNP in the last 8760 hours. HbA1C: No results for input(s): HGBA1C in the last 72 hours. CBG: No results for input(s): GLUCAP in the last 168 hours. Lipid Profile: No results for input(s): CHOL, HDL, LDLCALC, TRIG, CHOLHDL, LDLDIRECT in the last 72 hours. Thyroid Function Tests: No results for input(s): TSH, T4TOTAL, FREET4, T3FREE, THYROIDAB in the last 72 hours. Anemia Panel: No results for input(s): VITAMINB12, FOLATE, FERRITIN, TIBC, IRON, RETICCTPCT in the last 72 hours. Urine  analysis:    Component Value Date/Time   COLORURINE AMBER (A) 10/11/2015 0930   APPEARANCEUR CLOUDY (A) 10/11/2015 0930   LABSPEC 1.030 10/11/2015 0930   PHURINE 5.5 10/11/2015 0930   GLUCOSEU NEGATIVE 10/11/2015 0930   HGBUR NEGATIVE 10/11/2015 0930   BILIRUBINUR SMALL (A) 10/11/2015 0930   KETONESUR NEGATIVE 10/11/2015 0930   PROTEINUR NEGATIVE 10/11/2015 0930   UROBILINOGEN 1.0 01/18/2013 0149   NITRITE NEGATIVE 10/11/2015 0930   LEUKOCYTESUR NEGATIVE 10/11/2015 0930    Radiological Exams on Admission: Dg Abdomen Acute W/chest  Result Date: 02/26/2017 CLINICAL DATA:  New onset mid abdominal pain with nausea and vomiting, 24 hours duration. EXAM: DG ABDOMEN ACUTE W/ 1V CHEST COMPARISON:  None. FINDINGS: There is no evidence of dilated bowel loops or free intraperitoneal air. No radiopaque calculi or other significant radiographic abnormality is seen. Heart size and mediastinal contours are within normal limits.  Both lungs are clear. IMPRESSION: Negative abdominal radiographs.  No acute cardiopulmonary disease. Electronically Signed   By: Ellery Plunk M.D.   On: 02/26/2017 06:12    EKG: None  Assessment/Plan AKI (acute kidney injury) (HCC) Likely multifactorial in the setting of poor oral intake with alcohol consumption and NSAID use. Baseline creatinine around 1.2, check CK is a high probability of having mild rhabdomyolysis. Start him on aggressive IV fluid hydration and recheck a basic metabolic panel in the morning. We will give him Zofran and Phenergan for nausea. We'll give him IV Protonix  Fever: Unclear etiology question possible early stages of heat stroke. No leukocytosis, will not start empiric antibiotics at this point. We'll continue to monitor fever curve. Lungs are clear and physical exam skin has no cuts source.  Alcohol abuse: We'll start him thiamine daily and will monitor CIWA score. If his score is greater than 10, the nurses to call the physician to  start benzodiazepine protocol.    DVT prophylaxis: Early ambulation Code Status: full Family Communication: girlfriend Disposition Plan:  Consults called: noe Admission status: inpatient   Marinda Elk MD [facility] Pager [phone]  If 7PM-7AM, please contact night-coverage [email or web address] Password [email or web address]  02/26/2017, 7:52 AM

## 2017-02-26 NOTE — ED Notes (Signed)
Bed: WA09 Expected date:  Expected time:  Means of arrival:  Comments: 21 yr old, n,v

## 2017-02-26 NOTE — ED Triage Notes (Signed)
Pt BIB EMS from home for complaints of nausea and vomiting. 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 At 1200 yesterday patient took a 30mg  Adderall because he was tired and needed a pick me up. At 1400 yesterday patient began having nausea, vomiting, no diarrhea and abdominal cramping. Patient later began to see dark red streaks in his vomit in his emesis. Hx of ETOH abuse, no hx of bleeding.

## 2017-02-26 NOTE — Progress Notes (Signed)
Patient went home against medical advice, Dr Robb Matar made aware, girlfriend with patient.In no apparent distress.

## 2017-02-27 DIAGNOSIS — R112 Nausea with vomiting, unspecified: Secondary | ICD-10-CM

## 2017-02-27 LAB — HIV ANTIBODY (ROUTINE TESTING W REFLEX): HIV Screen 4th Generation wRfx: NONREACTIVE

## 2017-02-27 NOTE — Discharge Summary (Signed)
Physician Discharge Summary  Eugene Cohen BMW:[id number] DOB: [birth year] DOA: 02/26/2017  PCP: Patient, No Pcp Per  Admit date: 02/26/2017 Discharge date: 02/27/2017  Admitted From: home Disposition:    Recommendations for Outpatient Follow-up:   LEFT AMA  Home Health:no Equipment/Devices:None  Discharge Condition:stable CODE STATUS:Full Diet recommendation: Heart Healthy  Brief/Interim Summary:  21 y.o. male with medical history significant of marijuana use, daily alcohol and ibuprofen for back pain who works as a Corporate investment banker comes in for nausea and vomiting that started about 2 days prior to admission. She started having nausea and vomiting nonbloody, 4 days prior to this he was drinking heavily, and taking ongoing ibuprofen for back pain. He relates after this he started having abdominal pain. His abdominal pain is around the rib cage. Yesterday he worked outside for about 6 hours, starting at noon and he started feeling dizzy by the end of the day so he stopped.  Discharge Diagnoses:  Active Problems:   AKI (acute kidney injury) (HCC)   Fever   Non-intractable vomiting with nausea  Patient was started on IV fluids for his acute renal failure whichc was thought to be multifactorial. When the patient got to the floor he decided he wanted to leave AGAINST EDICAL ADVICE. Risk and that outcomes were discussed with the patient and he acknowledged understanding. Despite this he still left against medical place.   Discharge Instructions   Allergies as of 02/26/2017      Reactions   Penicillins Swelling   Has patient had a PCN reaction causing immediate rash, facial/tongue/throat swelling, SOB or lightheadedness with hypotension: Unknown Has patient had a PCN reaction causing severe rash involving mucus membranes or skin necrosis: Unknown Has patient had a PCN reaction that required hospitalization: Unknown Has patient had a PCN reaction occurring within the last 10 years:  No If all of the above answers are "NO", then may proceed with Cephalosporin use.      Medication List    ASK your doctor about these medications   amphetamine-dextroamphetamine 30 MG 24 hr capsule Commonly known as:  ADDERALL XR Take 30 mg by mouth daily.   DAYQUIL PO Take 1 capsule by mouth every 4 (four) hours as needed (cold).   diclofenac 75 MG EC tablet Commonly known as:  VOLTAREN Take 1 tablet (75 mg total) by mouth 2 (two) times daily. Take with food   guaiFENesin 600 MG 12 hr tablet Commonly known as:  MUCINEX Take 600 mg by mouth once.   ibuprofen 200 MG tablet Commonly known as:  ADVIL,MOTRIN Take 600 mg by mouth every 6 (six) hours as needed for moderate pain.   ondansetron 4 MG tablet Commonly known as:  ZOFRAN Take 1 tablet (4 mg total) by mouth every 6 (six) hours as needed for nausea or vomiting.       Allergies  Allergen Reactions  . Penicillins Swelling    Has patient had a PCN reaction causing immediate rash, facial/tongue/throat swelling, SOB or lightheadedness with hypotension: Unknown Has patient had a PCN reaction causing severe rash involving mucus membranes or skin necrosis: Unknown Has patient had a PCN reaction that required hospitalization: Unknown Has patient had a PCN reaction occurring within the last 10 years: No If all of the above answers are "NO", then may proceed with Cephalosporin use.     Consultations:     Procedures/Studies: Dg Abdomen Acute W/chest  Result Date: 02/26/2017 CLINICAL DATA:  New onset mid abdominal pain with nausea and vomiting, 24  hours duration. EXAM: DG ABDOMEN ACUTE W/ 1V CHEST COMPARISON:  None. FINDINGS: There is no evidence of dilated bowel loops or free intraperitoneal air. No radiopaque calculi or other significant radiographic abnormality is seen. Heart size and mediastinal contours are within normal limits. Both lungs are clear. IMPRESSION: Negative abdominal radiographs.  No acute cardiopulmonary  disease. Electronically Signed   By: Ellery Plunk M.D.   On: 02/26/2017 06:12       Subjective:   Discharge Exam: Vitals:   02/26/17 0934 02/26/17 1012  BP: (!) 159/102 (!) 148/104  Pulse: 86 79  Resp: 16 18  Temp: 98 F (36.7 C) 98.8 F (37.1 C)  SpO2: 98% 98%   Vitals:   02/26/17 0542 02/26/17 0729 02/26/17 0934 02/26/17 1012  BP: (!) 168/115 (!) 166/111 (!) 159/102 (!) 148/104  Pulse: (!) 109 99 86 79  Resp: 18 16 16 18   Temp: (!) 100.6 F (38.1 C)  98 F (36.7 C) 98.8 F (37.1 C)  TempSrc: Rectal  Oral Oral  SpO2: 97% 100% 98% 98%  Weight:    71.3 kg (157 lb 3 oz)  Height:    6' (1.829 m)    General: Pt is alert, awake, not in acute distress Cardiovascular: RRR, S1/S2 +, no rubs, no gallops Respiratory: CTA bilaterally, no wheezing, no rhonchi Abdominal: Soft, NT, ND, bowel sounds + Extremities: no edema, no cyanosis    The results of significant diagnostics from this hospitalization (including imaging, microbiology, ancillary and laboratory) are listed below for reference.     Microbiology: No results found for this or any previous visit (from the past 240 hour(s)).   Labs: BNP (last 3 results) No results for input(s): BNP in the last 8760 hours. Basic Metabolic Panel:  Recent Labs Lab 02/26/17 0549  NA 135  K 3.5  CL 97*  CO2 22  GLUCOSE 164*  BUN 14  CREATININE 2.82*  CALCIUM 9.6   Liver Function Tests:  Recent Labs Lab 02/26/17 0549  AST 31  ALT 22  ALKPHOS 82  BILITOT 0.8  PROT 8.3*  ALBUMIN 4.8    Recent Labs Lab 02/26/17 0549  LIPASE 29   No results for input(s): AMMONIA in the last 168 hours. CBC:  Recent Labs Lab 02/26/17 0549  WBC 8.8  NEUTROABS 6.3  HGB 18.3*  HCT 49.7  MCV 88.9  PLT 311   Cardiac Enzymes:  Recent Labs Lab 02/26/17 0811  CKTOTAL 202   BNP: Invalid input(s): POCBNP CBG: No results for input(s): GLUCAP in the last 168 hours. D-Dimer No results for input(s): DDIMER in the  last 72 hours. Hgb A1c No results for input(s): HGBA1C in the last 72 hours. Lipid Profile No results for input(s): CHOL, HDL, LDLCALC, TRIG, CHOLHDL, LDLDIRECT in the last 72 hours. Thyroid function studies No results for input(s): TSH, T4TOTAL, T3FREE, THYROIDAB in the last 72 hours.  Invalid input(s): FREET3 Anemia work up No results for input(s): VITAMINB12, FOLATE, FERRITIN, TIBC, IRON, RETICCTPCT in the last 72 hours. Urinalysis    Component Value Date/Time   COLORURINE AMBER (A) 02/26/2017 0810   APPEARANCEUR CLOUDY (A) 02/26/2017 0810   LABSPEC 1.019 02/26/2017 0810   PHURINE 5.0 02/26/2017 0810   GLUCOSEU NEGATIVE 02/26/2017 0810   HGBUR NEGATIVE 02/26/2017 0810   BILIRUBINUR SMALL (A) 02/26/2017 0810   KETONESUR 20 (A) 02/26/2017 0810   PROTEINUR 100 (A) 02/26/2017 0810   UROBILINOGEN 1.0 01/18/2013 0149   NITRITE NEGATIVE 02/26/2017 0810   LEUKOCYTESUR NEGATIVE 02/26/2017  0810   Sepsis Labs Invalid input(s): PROCALCITONIN,  WBC,  LACTICIDVEN Microbiology No results found for this or any previous visit (from the past 240 hour(s)).    SIGNED:   Marinda ElkFELIZ ORTIZ, Londen Lorge, MD  [facility] 02/27/2017, 11:08 AM Pager   If 7PM-7AM, please contact night-coverage [email or web address] Password [email or web address]

## 2017-08-18 ENCOUNTER — Emergency Department (HOSPITAL_COMMUNITY)
Admission: EM | Admit: 2017-08-18 | Discharge: 2017-08-18 | Disposition: A | Discharge status: Home / Self Care | Payer: Self-pay | Attending: Emergency Medicine | Admitting: Emergency Medicine

## 2017-08-18 ENCOUNTER — Encounter (HOSPITAL_COMMUNITY): Payer: Self-pay | Admitting: Emergency Medicine

## 2017-08-18 ENCOUNTER — Other Ambulatory Visit: Payer: Self-pay

## 2017-08-18 DIAGNOSIS — R05 Cough: Secondary | ICD-10-CM | POA: Insufficient documentation

## 2017-08-18 DIAGNOSIS — R5383 Other fatigue: Secondary | ICD-10-CM | POA: Insufficient documentation

## 2017-08-18 DIAGNOSIS — R52 Pain, unspecified: Secondary | ICD-10-CM | POA: Insufficient documentation

## 2017-08-18 DIAGNOSIS — A084 Viral intestinal infection, unspecified: Secondary | ICD-10-CM

## 2017-08-18 DIAGNOSIS — F17210 Nicotine dependence, cigarettes, uncomplicated: Secondary | ICD-10-CM | POA: Insufficient documentation

## 2017-08-18 DIAGNOSIS — R6889 Other general symptoms and signs: Secondary | ICD-10-CM

## 2017-08-18 DIAGNOSIS — K529 Noninfective gastroenteritis and colitis, unspecified: Secondary | ICD-10-CM | POA: Insufficient documentation

## 2017-08-18 LAB — CBC WITH DIFFERENTIAL/PLATELET
BASOS ABS: 0 10*3/uL (ref 0.0–0.1)
BASOS PCT: 0 %
EOS ABS: 0.1 10*3/uL (ref 0.0–0.7)
EOS PCT: 2 %
HCT: 45.2 % (ref 39.0–52.0)
Hemoglobin: 15 g/dL (ref 13.0–17.0)
Lymphocytes Relative: 33 %
Lymphs Abs: 1.9 10*3/uL (ref 0.7–4.0)
MCH: 31.4 pg (ref 26.0–34.0)
MCHC: 33.2 g/dL (ref 30.0–36.0)
MCV: 94.8 fL (ref 78.0–100.0)
Monocytes Absolute: 0.7 10*3/uL (ref 0.1–1.0)
Monocytes Relative: 11 %
Neutro Abs: 3.2 10*3/uL (ref 1.7–7.7)
Neutrophils Relative %: 54 %
PLATELETS: 269 10*3/uL (ref 150–400)
RBC: 4.77 MIL/uL (ref 4.22–5.81)
RDW: 12.6 % (ref 11.5–15.5)
WBC: 6 10*3/uL (ref 4.0–10.5)

## 2017-08-18 LAB — COMPREHENSIVE METABOLIC PANEL
ALBUMIN: 4.6 g/dL (ref 3.5–5.0)
ALT: 27 U/L (ref 17–63)
AST: 26 U/L (ref 15–41)
Alkaline Phosphatase: 50 U/L (ref 38–126)
Anion gap: 10 (ref 5–15)
BUN: 13 mg/dL (ref 6–20)
CHLORIDE: 101 mmol/L (ref 101–111)
CO2: 29 mmol/L (ref 22–32)
Calcium: 10.1 mg/dL (ref 8.9–10.3)
Creatinine, Ser: 0.87 mg/dL (ref 0.61–1.24)
GFR calc Af Amer: >60 mL/min (ref >60)
GFR calc non Af Amer: >60 mL/min (ref >60)
GLUCOSE: 85 mg/dL (ref 65–99)
Potassium: 4.8 mmol/L (ref 3.5–5.1)
SODIUM: 140 mmol/L (ref 135–145)
Total Bilirubin: 0.3 mg/dL (ref 0.3–1.2)
Total Protein: 7.3 g/dL (ref 6.5–8.1)

## 2017-08-18 LAB — INFLUENZA PANEL BY PCR (TYPE A & B)
INFLBPCR: NEGATIVE
Influenza A By PCR: NEGATIVE

## 2017-08-18 LAB — LIPASE, BLOOD: Lipase: 46 U/L (ref 11–51)

## 2017-08-18 MED ORDER — LOPERAMIDE HCL 2 MG PO CAPS: 2.0000 mg | ORAL_CAPSULE | Freq: Four times a day (QID) | ORAL | 0 refills | Status: AC | PRN

## 2017-08-18 MED ORDER — ONDANSETRON 4 MG PO TBDP: 4.0000 mg | ORAL_TABLET | Freq: Three times a day (TID) | ORAL | 0 refills | Status: AC | PRN

## 2017-08-18 NOTE — Discharge Instructions (Signed)
Rest to make sure you are drinking plenty of fluids.  You may use the medications as prescribed if needed for continued nausea, vomiting or diarrhea.  You may want to consider maintaining the brat diet for the next 24 hours which consists of bananas, rice, applesauce and dry toast, foods that are well tolerated on an upset stomach.  Your lab tests are negative today including your influenza screen.  Get rechecked for any worsening or persistent symptoms, I suspect your symptoms will resolve spontaneously, hopefully over the next 48 hours.

## 2017-08-18 NOTE — ED Triage Notes (Signed)
Flu like symptoms for three days. No fever today. States girlfriend and uncle sick with same.

## 2017-08-18 NOTE — ED Provider Notes (Signed)
[facility] EMERGENCY DEPARTMENT Provider Note   CSN: [id number] Arrival date & time: 08/18/17  [id number]     History   Chief Complaint Chief Complaint  Patient presents with  . Nasal Congestion    flu like symptoms    HPI Baldwin CrownBryan E Hessel is a 22 y.o. male presenting with flulike symptoms including generalized fatigue, body aches, mild nonproductive cough although he states his cough is unchanged from his chronic smoker's cough and nasal congestion.  In addition he endorses nausea with emesis x3 this morning in addition to 3 episodes of diarrhea.  He denies abdominal pain, although had abdominal cramping prior to these episodes this morning.  This symptom appears to be improved at this time per his report.  He has had no medications prior to arrival for symptoms.  He denies chest pain, shortness of breath, wheezing, headache, neck pain or stiffness, rash, sore throat.  The history is provided by the patient.    Past Medical History:  Diagnosis Date  . Depression     Patient Active Problem List   Diagnosis Date Noted  . Non-intractable vomiting with nausea   . AKI (acute kidney injury) (HCC) 02/26/2017  . Fever 02/26/2017    Past Surgical History:  Procedure Laterality Date  . TONSILLECTOMY         Home Medications    Prior to Admission medications   Medication Sig Start Date End Date Taking? Authorizing Provider  amphetamine-dextroamphetamine (ADDERALL XR) 30 MG 24 hr capsule Take 30 mg by mouth daily.    [provider]  diclofenac (VOLTAREN) 75 MG EC tablet Take 1 tablet (75 mg total) by mouth 2 (two) times daily. Take with food Patient not taking: Reported on 02/26/2017 02/13/16   Pauline Ausriplett, Tammy, PA-C  guaiFENesin (MUCINEX) 600 MG 12 hr tablet Take 600 mg by mouth once.    [provider]  ibuprofen (ADVIL,MOTRIN) 200 MG tablet Take 600 mg by mouth every 6 (six) hours as needed for moderate pain.    [provider]  loperamide (IMODIUM) 2 MG  capsule Take 1 capsule (2 mg total) by mouth 4 (four) times daily as needed for diarrhea or loose stools. 08/18/17   Burgess AmorIdol, Saydee Zolman, PA-C  ondansetron (ZOFRAN ODT) 4 MG disintegrating tablet Take 1 tablet (4 mg total) by mouth every 8 (eight) hours as needed for nausea or vomiting. 08/18/17   Josefina Rynders, Raynelle FanningJulie, PA-C  ondansetron (ZOFRAN) 4 MG tablet Take 1 tablet (4 mg total) by mouth every 6 (six) hours as needed for nausea or vomiting. Patient not taking: Reported on 02/26/2017 10/11/15   Melton Krebsiley, Samantha Nicole, PA-C  Pseudoephedrine-APAP-DM (DAYQUIL PO) Take 1 capsule by mouth every 4 (four) hours as needed (cold).    [provider]    Family History Family History  Problem Relation Age of Onset  . Diabetes Unknown     Social History Social History   Tobacco Use  . Smoking status: Current Every Day Smoker    Packs/day: 2.00    Types: Cigarettes  . Smokeless tobacco: Never Used  Substance Use Topics  . Alcohol use: Yes    Alcohol/week: 10.2 oz    Types: 5 Cans of beer, 12 Shots of liquor per week  . Drug use: Yes    Types: Marijuana     Allergies   Penicillins   Review of Systems Review of Systems  Constitutional: Positive for chills, fatigue and fever.  HENT: Positive for congestion. Negative for ear pain, rhinorrhea, sinus pressure,  sore throat, trouble swallowing and voice change.   Eyes: Negative for discharge.  Respiratory: Positive for cough. Negative for shortness of breath, wheezing and stridor.   Cardiovascular: Negative for chest pain.  Gastrointestinal: Positive for diarrhea, nausea and vomiting. Negative for abdominal pain.  Genitourinary: Negative.   Musculoskeletal: Positive for myalgias.     Physical Exam Updated Vital Signs BP 129/79 (BP Location: Right Arm)   Pulse 79   Temp 98.6 F (37 C) (Oral)   Resp 18   Ht 6' (1.829 m)   Wt 71.2 kg (157 lb)   SpO2 99%   BMI 21.29 kg/m   Physical Exam  Constitutional: He appears well-developed and  well-nourished.  HENT:  Head: Normocephalic and atraumatic.  Mouth/Throat: Oropharynx is clear and moist.  Eyes: Conjunctivae are normal.  Neck: Normal range of motion. Neck supple.  Cardiovascular: Normal rate, regular rhythm, normal heart sounds and intact distal pulses.  Pulmonary/Chest: Effort normal and breath sounds normal. No respiratory distress. He has no wheezes.  Abdominal: Soft. Bowel sounds are normal. He exhibits no distension and no mass. There is no tenderness. There is no guarding.  Musculoskeletal: Normal range of motion.  Neurological: He is alert.  Skin: Skin is warm and dry.  Psychiatric: He has a normal mood and affect.  Nursing note and vitals reviewed.    ED Treatments / Results  Labs (all labs ordered are listed, but only abnormal results are displayed) Labs Reviewed  INFLUENZA PANEL BY PCR (TYPE A & B)  CBC WITH DIFFERENTIAL/PLATELET  COMPREHENSIVE METABOLIC PANEL  LIPASE, BLOOD    EKG  EKG Interpretation None       Radiology No results found.  Procedures Procedures (including critical care time)  Medications Ordered in ED Medications - No data to display   Initial Impression / Assessment and Plan / ED Course  I have reviewed the triage vital signs and the nursing notes.  Pertinent labs & imaging results that were available during my care of the patient were reviewed by me and considered in my medical decision making (see chart for details).     Labs reviewed and discussed with patient.  He had no nausea, vomiting or diarrhea while in the department.  We discussed the nausea medication here followed by a p.o. challenge, but patient refused.  Suspect this is a viral gastroenteritis, patient states his girlfriend and another family member has developed similar symptoms just today.  We will prescribe Zofran, Imodium.  Discussed brat diet.  Rest, increase fluid intake.  PRN follow-up anticipated.  Final Clinical Impressions(s) / ED Diagnoses     Final diagnoses:  Flu-like symptoms  Gastroenteritis and colitis, viral    ED Discharge Orders        Ordered    ondansetron (ZOFRAN ODT) 4 MG disintegrating tablet  Every 8 hours PRN     08/18/17 1059    loperamide (IMODIUM) 2 MG capsule  4 times daily PRN     08/18/17 1059       Burgess Amor, PA-C 08/18/17 1103    Mesner, Barbara Cower, MD 08/19/17 [phone]

## 2019-05-06 IMAGING — CR DG ABDOMEN ACUTE W/ 1V CHEST
4 series · 4 of 4 positions shown · non-contrast
Comparison: None.

CLINICAL DATA: New onset mid abdominal pain with nausea and
vomiting, 24 hours duration.

EXAM:
DG ABDOMEN ACUTE W/ 1V CHEST

[w chest pa]
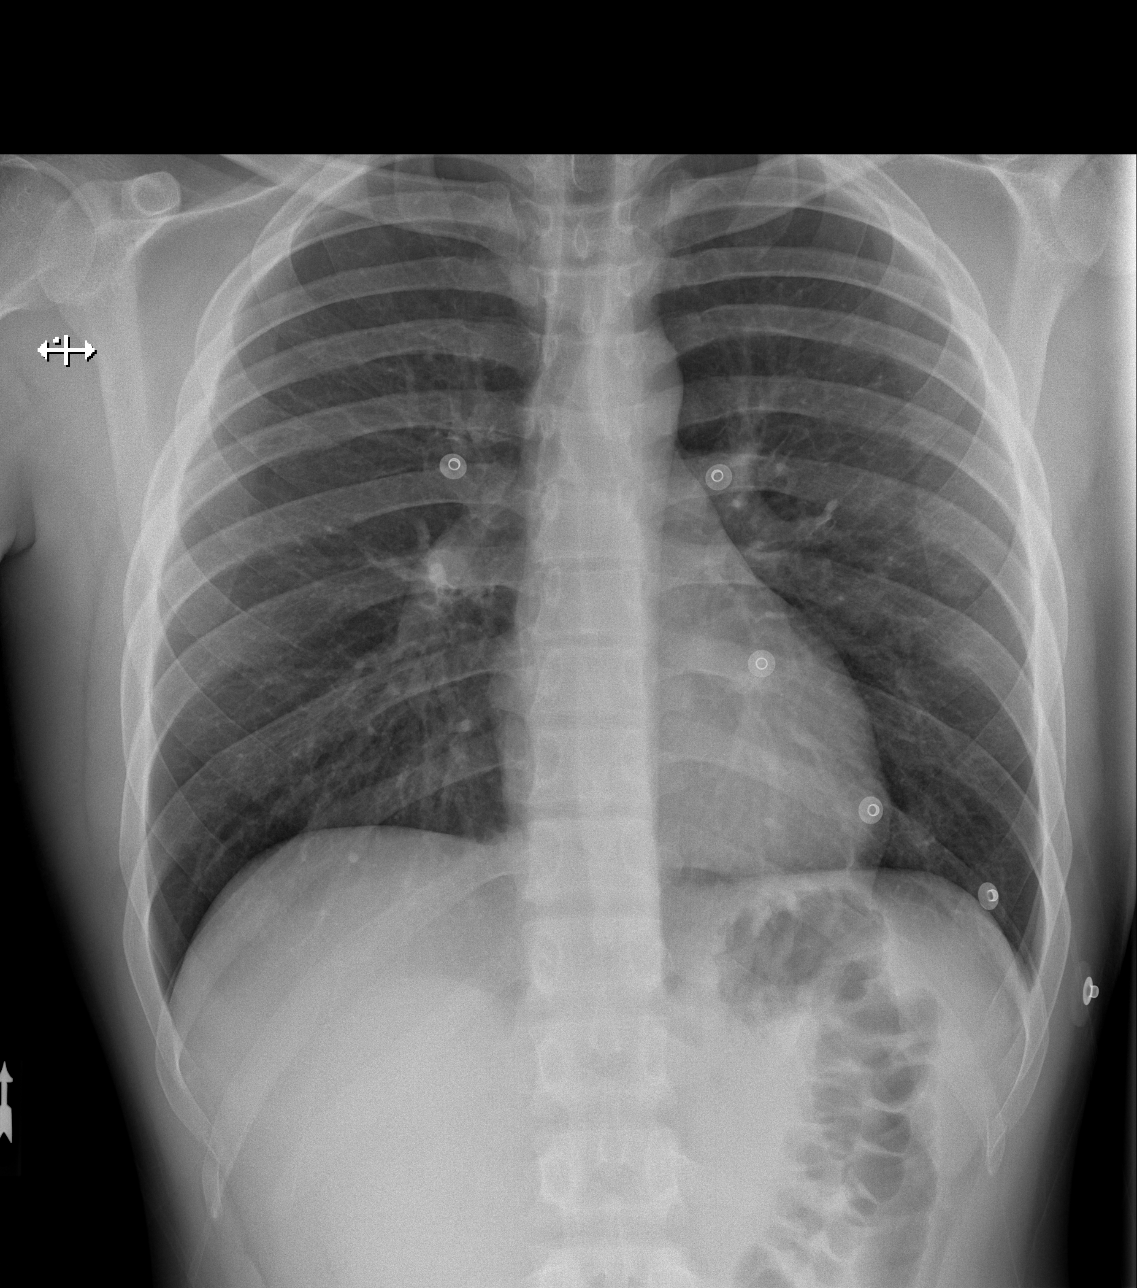

[w abdomen upright]
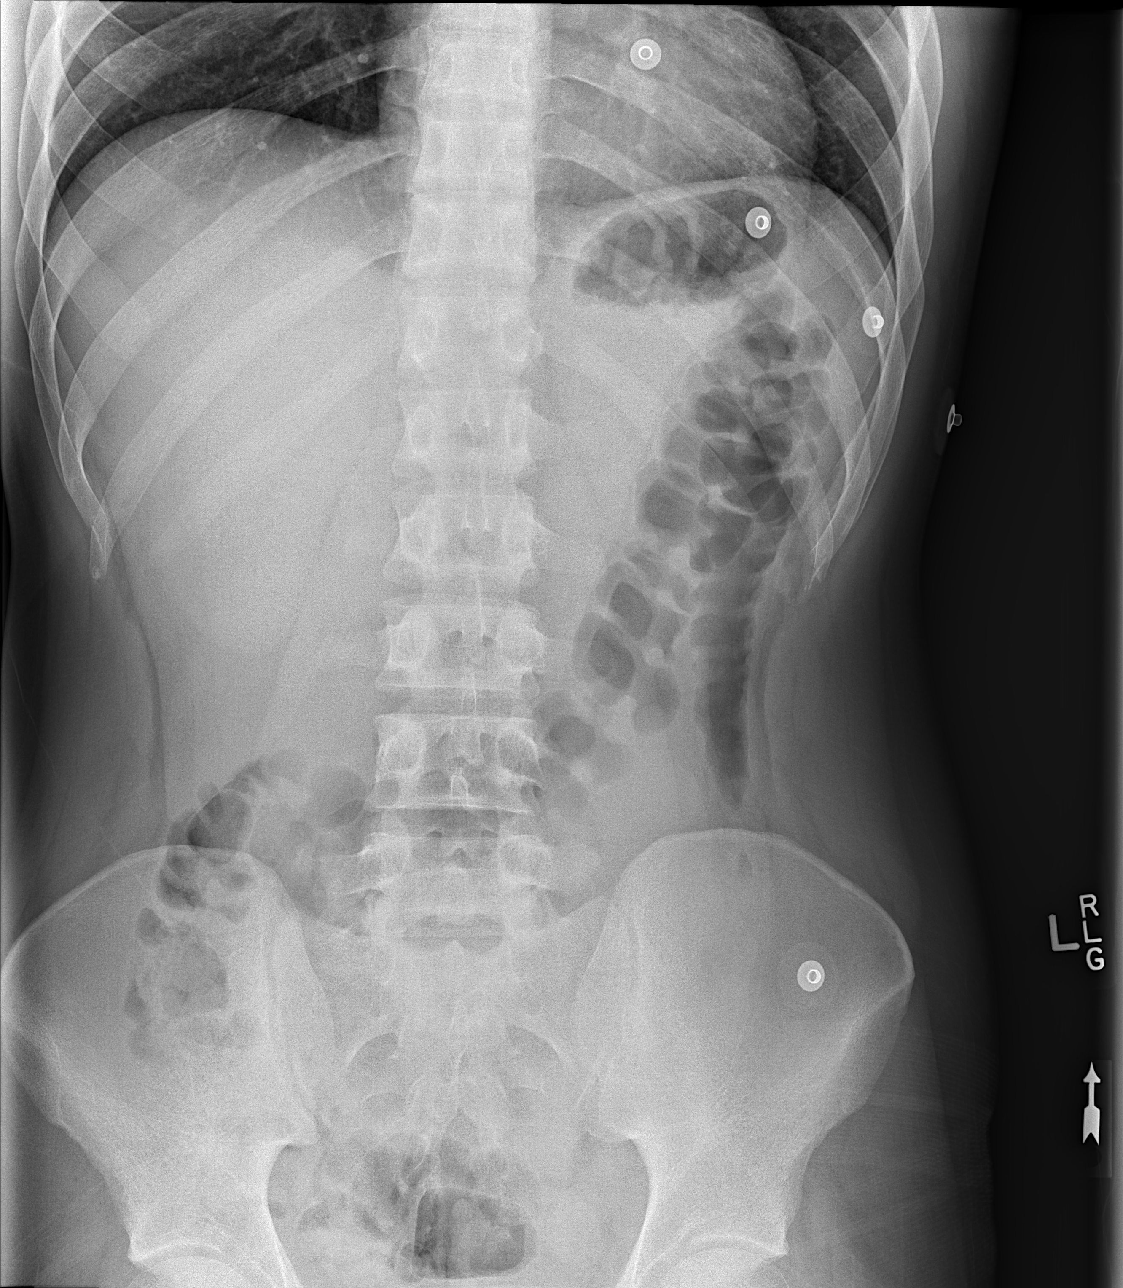

[t abdomen supine (1 of 2)]
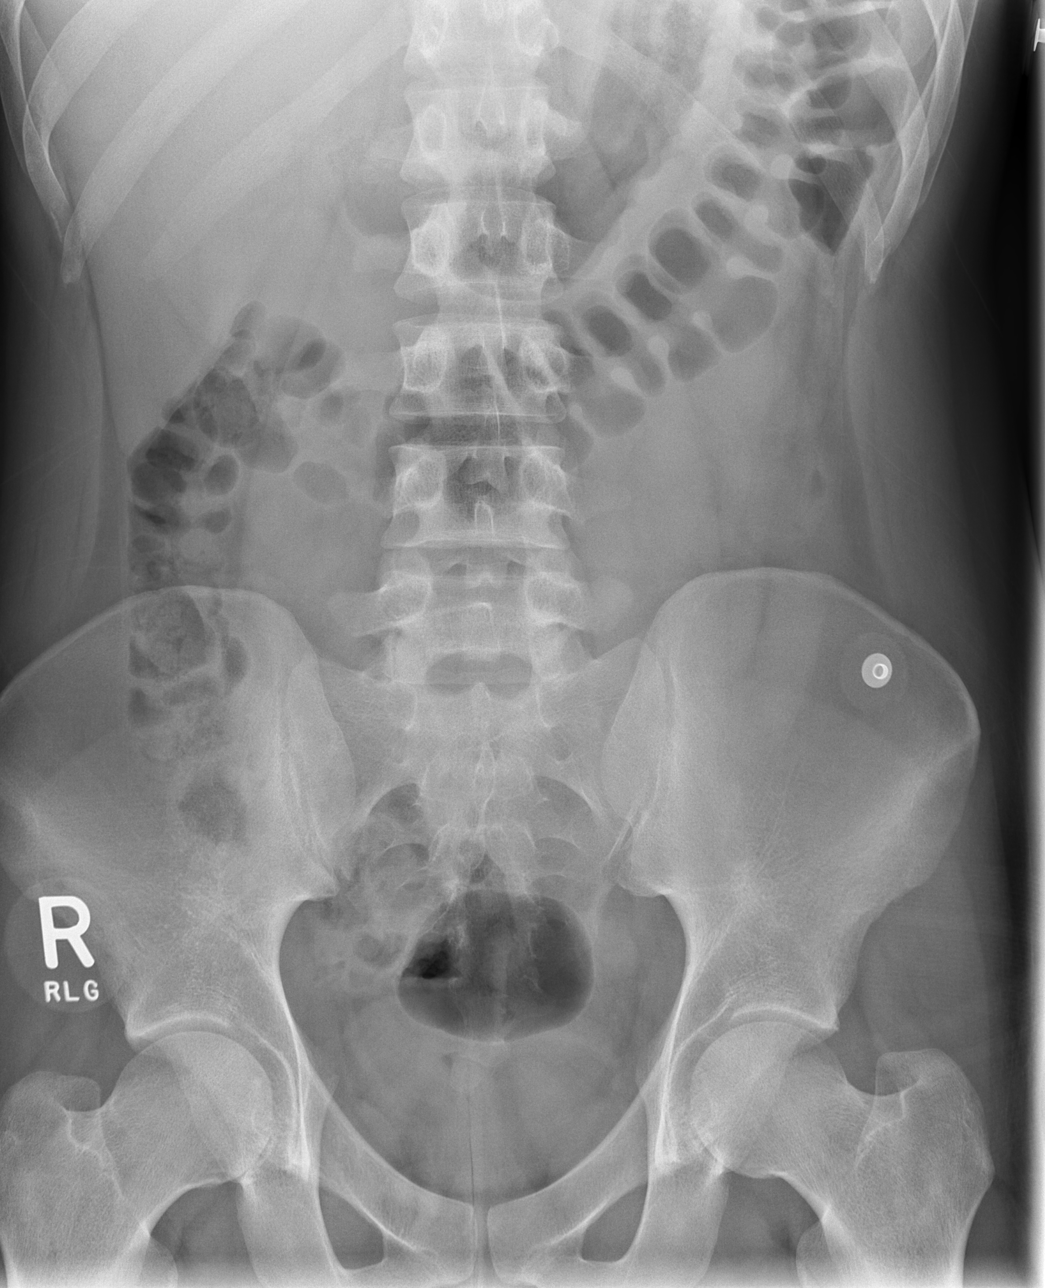

[t abdomen supine (2 of 2)]
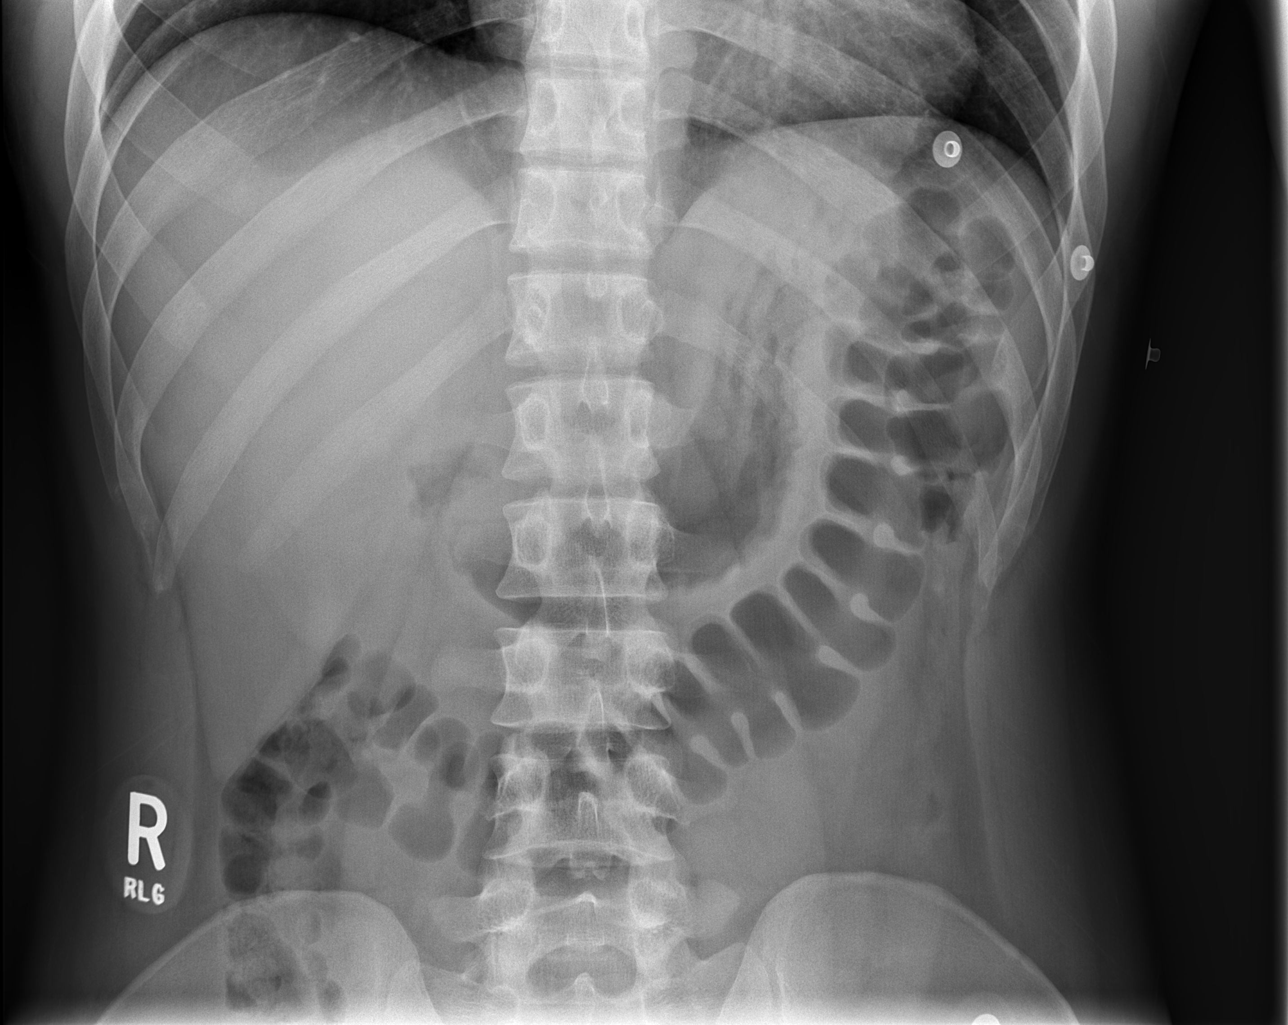

[4 of 4 positions shown; findings below may reference images not displayed]

FINDINGS: There is no evidence of dilated bowel loops or free intraperitoneal
air. No radiopaque calculi or other significant radiographic
abnormality is seen. Heart size and mediastinal contours are within
normal limits. Both lungs are clear.
IMPRESSION: Negative abdominal radiographs.  No acute cardiopulmonary disease.

## 2020-05-01 ENCOUNTER — Other Ambulatory Visit: Payer: Self-pay

## 2020-05-01 ENCOUNTER — Ambulatory Visit: Payer: Medicaid Other | Admitting: Nurse Practitioner

## 2021-09-11 ENCOUNTER — Emergency Department (HOSPITAL_COMMUNITY)
Admission: EM | Admit: 2021-09-11 | Discharge: 2021-09-11 | Disposition: A | Discharge status: Home / Self Care | Payer: Medicaid Other | Attending: Emergency Medicine | Admitting: Emergency Medicine

## 2021-09-11 ENCOUNTER — Encounter (HOSPITAL_COMMUNITY): Payer: Self-pay | Admitting: *Deleted

## 2021-09-11 DIAGNOSIS — M542 Cervicalgia: Secondary | ICD-10-CM | POA: Insufficient documentation

## 2021-09-11 DIAGNOSIS — R519 Headache, unspecified: Secondary | ICD-10-CM | POA: Insufficient documentation

## 2021-09-11 MED ORDER — NAPROXEN 500 MG PO TABS: 500.0000 mg | ORAL_TABLET | Freq: Two times a day (BID) | ORAL | 0 refills | Status: AC

## 2021-09-11 NOTE — Discharge Instructions (Addendum)
You have been provided the contact information for an health care facility by the name of [facility].  Please call to schedule an appointment and establish care. ? ?You may rest your neck for the next few days.  A work note has been provided for you. ? ?An optional anti-inflammatory has been sent to your pharmacy for pain relief.  Please take one tablet every 12 hours as needed and take with food. ?

## 2021-09-11 NOTE — ED Provider Notes (Signed)
? EMERGENCY DEPARTMENT ?Provider Note ? ? ?CSN: [id number] ?Arrival date & time: 09/11/21  1150 ? ?  ? ?History ? ?Chief Complaint  ?Patient presents with  ? Neck Pain  ? ? ?Eugene Teall. is a 26 y.o. male presenting today with continued neck pain over the last year.  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  Describes intermittent pain to both sides of his neck.  Denies bony tenderness or numbness/tingling of the upper extremities.  Denies recent illness or fever.  States the pain radiates down his neck to his upper back and sometimes is accompanied with headache.  Pain worst with twisting motions.  Patient was evaluated in [facility] ED for the same complaint one year ago.  Requests a CT scan. ? ?The history is provided by the patient and medical records.  ?Neck Pain ? ?  ? ?Home Medications ?Prior to Admission medications   ?Medication Sig Start Date End Date Taking? Authorizing Provider  ?acetaminophen (TYLENOL) 325 MG tablet Take 650 mg by mouth every 6 (six) hours as needed for mild pain.   Yes [provider]  ?amphetamine-dextroamphetamine (ADDERALL XR) 30 MG 24 hr capsule Take 30 mg by mouth daily.   Yes [provider]  ?naproxen (NAPROSYN) 500 MG tablet Take 1 tablet (500 mg total) by mouth 2 (two) times daily for 7 days. 09/11/21 09/18/21 Yes Cecil Cobbs, PA-C  ?loperamide (IMODIUM) 2 MG capsule Take 1 capsule (2 mg total) by mouth 4 (four) times daily as needed for diarrhea or loose stools. ?Patient not taking: Reported on 09/11/2021 08/18/17   Burgess Amor, PA-C  ?ondansetron (ZOFRAN ODT) 4 MG disintegrating tablet Take 1 tablet (4 mg total) by mouth every 8 (eight) hours as needed for nausea or vomiting. ?Patient not taking: Reported on 09/11/2021 08/18/17   Burgess Amor, PA-C  ?ondansetron (ZOFRAN) 4 MG tablet Take 1 tablet (4 mg total) by mouth every 6 (six) hours as needed for  nausea or vomiting. ?Patient not taking: Reported on 02/26/2017 10/11/15   Melton Krebs, PA-C  ?   ? ?Allergies    ?Penicillins   ? ?Review of Systems   ?Review of Systems  ?Musculoskeletal:  Positive for neck pain.  ? ?Physical Exam ?Updated Vital Signs ?BP (!) 160/105   Pulse 94   Temp 98.4 ?F (36.9 ?C) (Oral)   Resp 20   Ht 6' (1.829 m)   Wt 72.6 kg   SpO2 100%   BMI 21.70 kg/m?  ?Physical Exam ?Vitals and nursing note reviewed.  ?Constitutional:   ?   General: He is not in acute distress. ?   Appearance: Normal appearance. He is well-developed. He is not ill-appearing or diaphoretic.  ?HENT:  ?   Head: Normocephalic and atraumatic.  ?Eyes:  ?   General: No scleral icterus. ?   Conjunctiva/sclera: Conjunctivae normal.  ?Neck:  ? ?   Comments: Decreased ROM due to elicited/subjective tenderness ?TTP areas indicated above ?Cardiovascular:  ?   Rate and Rhythm: Normal rate and regular rhythm.  ?Pulmonary:  ?   Effort: Pulmonary effort is normal. No respiratory distress.  ?Abdominal:  ?   Palpations: Abdomen is soft.  ?Musculoskeletal:     ?   General: No swelling.  ?   Cervical back: Neck supple. No edema, erythema, signs of trauma, rigidity, torticollis or crepitus. Muscular tenderness present. No  spinous process tenderness.  ?Skin: ?   General: Skin is warm and dry.  ?   Capillary Refill: Capillary refill takes less than 2 seconds.  ?Neurological:  ?   Mental Status: He is alert.  ?Psychiatric:     ?   Mood and Affect: Mood normal.  ? ? ?ED Results / Procedures / Treatments   ?Labs ?(all labs ordered are listed, but only abnormal results are displayed) ?Labs Reviewed - No data to display ? ?EKG ?None ? ?Radiology ?No results found. ? ?Procedures ?Procedures  ? ? ?Medications Ordered in ED ?Medications - No data to display ? ?ED Course/ Medical Decision Making/ A&P ?  ?                        ?Medical Decision Making ?Risk ?Prescription drug management. ? ? ?Patient with neck pain.  No neurological  deficits and normal neuro exam.  Patient can walk and move neck with moderate ROM but states is painful.  No loss of bowel or bladder control.  Negative for radiculopathy symptoms.  Negative for torticollis.  No fever, night sweats, weight loss, recent illness, h/o cancer, IVDU.  RICE protocol and pain medicine indicated and discussed with patient. 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  Also recommended rest, stretches, and following up with primary care for re-evaluation. ? ?Test Considered: None ?  ?Critical Interventions: None ?  ?Consultations Obtained: None ? ?I discussed the patient and their case with my attending, Dr. Rhunette Croft, who agreed with the proposed treatment course.  After consideration of the diagnostic results and the patients response to treatment, I feel that the patent would benefit from short course of anti-inflammatory and rest with outpatient follow up with primary care.  Discussed course of treatment thoroughly with the patient and he demonstrated understanding.  Patient in agreement and has no further questions. ? ?This chart was dictated using voice recognition software.  Despite best efforts to proofread,  errors can occur which can change the documentation meaning. ? ? ? ? ? ? ? ? ?Final Clinical Impression(s) / ED Diagnoses ?Final diagnoses:  ?Neck pain  ? ? ?Rx / DC Orders ?ED Discharge Orders   ? ?      Ordered  ?  naproxen (NAPROSYN) 500 MG tablet  2 times daily       ? 09/11/21 1401  ? ?  ?  ? ?  ? ? ?  ?Cecil Cobbs, PA-C ?09/11/21 1417 ? ?  ?Derwood Kaplan, MD ?09/11/21 1825 ? ?

## 2021-09-11 NOTE — ED Triage Notes (Signed)
Neck pain, requesting CT scan ?

## 2022-05-31 DIAGNOSIS — Z419 Encounter for procedure for purposes other than remedying health state, unspecified: Secondary | ICD-10-CM | POA: Diagnosis not present

## 2022-07-01 DIAGNOSIS — Z419 Encounter for procedure for purposes other than remedying health state, unspecified: Secondary | ICD-10-CM | POA: Diagnosis not present

## 2022-08-01 DIAGNOSIS — Z419 Encounter for procedure for purposes other than remedying health state, unspecified: Secondary | ICD-10-CM | POA: Diagnosis not present

## 2022-08-30 DIAGNOSIS — Z419 Encounter for procedure for purposes other than remedying health state, unspecified: Secondary | ICD-10-CM | POA: Diagnosis not present

## 2022-09-30 DIAGNOSIS — Z419 Encounter for procedure for purposes other than remedying health state, unspecified: Secondary | ICD-10-CM | POA: Diagnosis not present

## 2022-10-30 DIAGNOSIS — Z419 Encounter for procedure for purposes other than remedying health state, unspecified: Secondary | ICD-10-CM | POA: Diagnosis not present

## 2022-11-30 DIAGNOSIS — Z419 Encounter for procedure for purposes other than remedying health state, unspecified: Secondary | ICD-10-CM | POA: Diagnosis not present

## 2023-01-30 DIAGNOSIS — Z419 Encounter for procedure for purposes other than remedying health state, unspecified: Secondary | ICD-10-CM | POA: Diagnosis not present

## 2023-07-02 DIAGNOSIS — Z419 Encounter for procedure for purposes other than remedying health state, unspecified: Secondary | ICD-10-CM | POA: Diagnosis not present

## 2023-08-02 DIAGNOSIS — Z419 Encounter for procedure for purposes other than remedying health state, unspecified: Secondary | ICD-10-CM | POA: Diagnosis not present

## 2023-08-30 DIAGNOSIS — Z419 Encounter for procedure for purposes other than remedying health state, unspecified: Secondary | ICD-10-CM | POA: Diagnosis not present

## 2023-10-11 DIAGNOSIS — Z419 Encounter for procedure for purposes other than remedying health state, unspecified: Secondary | ICD-10-CM | POA: Diagnosis not present

## 2023-11-10 DIAGNOSIS — Z419 Encounter for procedure for purposes other than remedying health state, unspecified: Secondary | ICD-10-CM | POA: Diagnosis not present

## 2023-12-11 DIAGNOSIS — Z419 Encounter for procedure for purposes other than remedying health state, unspecified: Secondary | ICD-10-CM | POA: Diagnosis not present

## 2024-04-20 DIAGNOSIS — X58XXXA Exposure to other specified factors, initial encounter: Secondary | ICD-10-CM | POA: Diagnosis not present

## 2024-04-20 DIAGNOSIS — M436 Torticollis: Secondary | ICD-10-CM | POA: Diagnosis not present

## 2024-04-20 DIAGNOSIS — M542 Cervicalgia: Secondary | ICD-10-CM | POA: Diagnosis not present

## 2024-04-20 DIAGNOSIS — S161XXA Strain of muscle, fascia and tendon at neck level, initial encounter: Secondary | ICD-10-CM | POA: Diagnosis not present

## 2024-06-11 DIAGNOSIS — Z419 Encounter for procedure for purposes other than remedying health state, unspecified: Secondary | ICD-10-CM | POA: Diagnosis not present
